# Patient Record
Sex: Male | Born: 1970 | Race: White | Hispanic: No | Marital: Single | Smoking: Former smoker
Health system: Southern US, Community
[De-identification: ages and names within clinical notes are randomized; demographics above are authoritative.]

## PROBLEM LIST (undated history)

## (undated) DIAGNOSIS — S42009A Fracture of unspecified part of unspecified clavicle, initial encounter for closed fracture: Secondary | ICD-10-CM

## (undated) DIAGNOSIS — S069X9A Unspecified intracranial injury with loss of consciousness of unspecified duration, initial encounter: Secondary | ICD-10-CM

## (undated) DIAGNOSIS — R51 Headache: Secondary | ICD-10-CM

## (undated) DIAGNOSIS — J189 Pneumonia, unspecified organism: Secondary | ICD-10-CM

## (undated) DIAGNOSIS — S069XAA Unspecified intracranial injury with loss of consciousness status unknown, initial encounter: Secondary | ICD-10-CM

## (undated) HISTORY — PX: WISDOM TOOTH EXTRACTION: SHX21

---

## 2013-09-30 ENCOUNTER — Emergency Department (HOSPITAL_COMMUNITY): Payer: BC Managed Care – PPO

## 2013-09-30 ENCOUNTER — Encounter (HOSPITAL_COMMUNITY): Payer: Self-pay | Admitting: Emergency Medicine

## 2013-09-30 ENCOUNTER — Inpatient Hospital Stay (HOSPITAL_COMMUNITY)
Admission: EM | Admit: 2013-09-30 | Discharge: 2013-10-05 | DRG: 086 | Disposition: A | Discharge status: Home / Self Care | Payer: BC Managed Care – PPO | Attending: General Surgery | Admitting: General Surgery

## 2013-09-30 ENCOUNTER — Inpatient Hospital Stay (HOSPITAL_COMMUNITY): Payer: BC Managed Care – PPO

## 2013-09-30 DIAGNOSIS — H748X9 Other specified disorders of middle ear and mastoid, unspecified ear: Secondary | ICD-10-CM | POA: Diagnosis present

## 2013-09-30 DIAGNOSIS — S42009A Fracture of unspecified part of unspecified clavicle, initial encounter for closed fracture: Secondary | ICD-10-CM | POA: Diagnosis present

## 2013-09-30 DIAGNOSIS — S0291XA Unspecified fracture of skull, initial encounter for closed fracture: Secondary | ICD-10-CM | POA: Diagnosis present

## 2013-09-30 DIAGNOSIS — S065XAA Traumatic subdural hemorrhage with loss of consciousness status unknown, initial encounter: Principal | ICD-10-CM | POA: Diagnosis present

## 2013-09-30 DIAGNOSIS — Z87891 Personal history of nicotine dependence: Secondary | ICD-10-CM | POA: Diagnosis not present

## 2013-09-30 DIAGNOSIS — S066X1A Traumatic subarachnoid hemorrhage with loss of consciousness of 30 minutes or less, initial encounter: Secondary | ICD-10-CM

## 2013-09-30 DIAGNOSIS — S066XAA Traumatic subarachnoid hemorrhage with loss of consciousness status unknown, initial encounter: Secondary | ICD-10-CM

## 2013-09-30 DIAGNOSIS — S42002A Fracture of unspecified part of left clavicle, initial encounter for closed fracture: Secondary | ICD-10-CM

## 2013-09-30 DIAGNOSIS — IMO0002 Reserved for concepts with insufficient information to code with codable children: Secondary | ICD-10-CM

## 2013-09-30 DIAGNOSIS — S066X9A Traumatic subarachnoid hemorrhage with loss of consciousness of unspecified duration, initial encounter: Secondary | ICD-10-CM

## 2013-09-30 DIAGNOSIS — S2239XA Fracture of one rib, unspecified side, initial encounter for closed fracture: Secondary | ICD-10-CM | POA: Diagnosis present

## 2013-09-30 DIAGNOSIS — T148XXA Other injury of unspecified body region, initial encounter: Secondary | ICD-10-CM

## 2013-09-30 DIAGNOSIS — S06309A Unspecified focal traumatic brain injury with loss of consciousness of unspecified duration, initial encounter: Principal | ICD-10-CM

## 2013-09-30 DIAGNOSIS — I498 Other specified cardiac arrhythmias: Secondary | ICD-10-CM | POA: Diagnosis present

## 2013-09-30 DIAGNOSIS — S02109A Fracture of base of skull, unspecified side, initial encounter for closed fracture: Secondary | ICD-10-CM | POA: Diagnosis not present

## 2013-09-30 DIAGNOSIS — D62 Acute posthemorrhagic anemia: Secondary | ICD-10-CM | POA: Diagnosis not present

## 2013-09-30 DIAGNOSIS — S0990XA Unspecified injury of head, initial encounter: Secondary | ICD-10-CM

## 2013-09-30 DIAGNOSIS — R4182 Altered mental status, unspecified: Secondary | ICD-10-CM

## 2013-09-30 LAB — CBC WITH DIFFERENTIAL/PLATELET
Basophils Absolute: 0 10*3/uL (ref 0.0–0.1)
Basophils Relative: 0 % (ref 0–1)
Eosinophils Absolute: 0 10*3/uL (ref 0.0–0.7)
Eosinophils Relative: 0 % (ref 0–5)
HCT: 40.9 % (ref 39.0–52.0)
Hemoglobin: 13.4 g/dL (ref 13.0–17.0)
Lymphocytes Relative: 8 % — ABNORMAL LOW (ref 12–46)
Lymphs Abs: 1.4 10*3/uL (ref 0.7–4.0)
MCH: 28.9 pg (ref 26.0–34.0)
MCHC: 32.8 g/dL (ref 30.0–36.0)
MCV: 88.3 fL (ref 78.0–100.0)
Monocytes Absolute: 0.9 10*3/uL (ref 0.1–1.0)
Monocytes Relative: 5 % (ref 3–12)
Neutro Abs: 15 10*3/uL — ABNORMAL HIGH (ref 1.7–7.7)
Neutrophils Relative %: 87 % — ABNORMAL HIGH (ref 43–77)
Platelets: 187 10*3/uL (ref 150–400)
RBC: 4.63 MIL/uL (ref 4.22–5.81)
RDW: 12.4 % (ref 11.5–15.5)
WBC: 17.3 10*3/uL — ABNORMAL HIGH (ref 4.0–10.5)

## 2013-09-30 LAB — TYPE AND SCREEN
ABO/RH(D): O POS
Antibody Screen: NEGATIVE

## 2013-09-30 LAB — GLUCOSE, CAPILLARY: Glucose-Capillary: 136 mg/dL — ABNORMAL HIGH (ref 70–99)

## 2013-09-30 LAB — COMPREHENSIVE METABOLIC PANEL
ALT: 23 U/L (ref 0–53)
AST: 33 U/L (ref 0–37)
Albumin: 3.8 g/dL (ref 3.5–5.2)
Alkaline Phosphatase: 74 U/L (ref 39–117)
Anion gap: 17 — ABNORMAL HIGH (ref 5–15)
BUN: 11 mg/dL (ref 6–23)
CO2: 19 mEq/L (ref 19–32)
Calcium: 9 mg/dL (ref 8.4–10.5)
Chloride: 103 mEq/L (ref 96–112)
Creatinine, Ser: 0.72 mg/dL (ref 0.50–1.35)
GFR calc Af Amer: >90 mL/min (ref >90)
GFR calc non Af Amer: >90 mL/min (ref >90)
Glucose, Bld: 142 mg/dL — ABNORMAL HIGH (ref 70–99)
Potassium: 3.6 mEq/L — ABNORMAL LOW (ref 3.7–5.3)
Sodium: 139 mEq/L (ref 137–147)
Total Bilirubin: 0.3 mg/dL (ref 0.3–1.2)
Total Protein: 7 g/dL (ref 6.0–8.3)

## 2013-09-30 LAB — RAPID URINE DRUG SCREEN, HOSP PERFORMED
Amphetamines: NOT DETECTED
Barbiturates: NOT DETECTED
Benzodiazepines: NOT DETECTED
Cocaine: NOT DETECTED
Opiates: POSITIVE — AB
Tetrahydrocannabinol: NOT DETECTED

## 2013-09-30 LAB — PROTIME-INR
INR: 1.1 (ref 0.00–1.49)
Prothrombin Time: 14.2 seconds (ref 11.6–15.2)

## 2013-09-30 LAB — APTT: aPTT: 24 seconds (ref 24–37)

## 2013-09-30 LAB — ABO/RH: ABO/RH(D): O POS

## 2013-09-30 LAB — CBG MONITORING, ED: Glucose-Capillary: 125 mg/dL — ABNORMAL HIGH (ref 70–99)

## 2013-09-30 LAB — ETHANOL: Alcohol, Ethyl (B): 105 mg/dL — ABNORMAL HIGH (ref 0–11)

## 2013-09-30 LAB — I-STAT CG4 LACTIC ACID, ED: Lactic Acid, Venous: 3.4 mmol/L — ABNORMAL HIGH (ref 0.5–2.2)

## 2013-09-30 MED ORDER — ONDANSETRON HCL 4 MG/2ML IJ SOLN
4.0000 mg | Freq: Once | INTRAMUSCULAR | Status: AC
  Administered 2013-09-30: 4 mg via INTRAVENOUS
  Filled 2013-09-30: qty 2

## 2013-09-30 MED ORDER — IOHEXOL 300 MG/ML  SOLN
100.0000 mL | Freq: Once | INTRAMUSCULAR | Status: AC | PRN
  Administered 2013-09-30: 100 mL via INTRAVENOUS

## 2013-09-30 MED ORDER — OXYCODONE HCL 5 MG PO TABS
10.0000 mg | ORAL_TABLET | ORAL | Status: DC | PRN
  Administered 2013-09-30 – 2013-10-04 (×9): 10 mg via ORAL
  Filled 2013-09-30 (×8): qty 2

## 2013-09-30 MED ORDER — ONDANSETRON HCL 4 MG/2ML IJ SOLN
INTRAMUSCULAR | Status: AC
  Administered 2013-09-30: 4 mg
  Filled 2013-09-30: qty 2

## 2013-09-30 MED ORDER — ACETAMINOPHEN 325 MG PO TABS
650.0000 mg | ORAL_TABLET | ORAL | Status: DC | PRN
  Administered 2013-10-01 – 2013-10-04 (×4): 650 mg via ORAL
  Filled 2013-09-30 (×5): qty 2

## 2013-09-30 MED ORDER — ONDANSETRON HCL 4 MG PO TABS: 4.0000 mg | ORAL_TABLET | Freq: Four times a day (QID) | ORAL | Status: DC | PRN

## 2013-09-30 MED ORDER — MORPHINE SULFATE 2 MG/ML IJ SOLN
2.0000 mg | INTRAMUSCULAR | Status: DC | PRN
  Administered 2013-09-30 – 2013-10-02 (×4): 2 mg via INTRAVENOUS
  Filled 2013-09-30 (×4): qty 1

## 2013-09-30 MED ORDER — PANTOPRAZOLE SODIUM 40 MG PO TBEC
40.0000 mg | DELAYED_RELEASE_TABLET | Freq: Every day | ORAL | Status: DC
  Administered 2013-10-03: 40 mg via ORAL
  Filled 2013-09-30 (×3): qty 1

## 2013-09-30 MED ORDER — SODIUM CHLORIDE 0.9 % IV BOLUS (SEPSIS)
1000.0000 mL | Freq: Once | INTRAVENOUS | Status: AC
  Administered 2013-09-30: 1000 mL via INTRAVENOUS

## 2013-09-30 MED ORDER — ONDANSETRON HCL 4 MG/2ML IJ SOLN
4.0000 mg | Freq: Four times a day (QID) | INTRAMUSCULAR | Status: DC | PRN
  Administered 2013-09-30 (×2): 4 mg via INTRAVENOUS
  Filled 2013-09-30 (×2): qty 2

## 2013-09-30 MED ORDER — TETANUS-DIPHTH-ACELL PERTUSSIS 5-2.5-18.5 LF-MCG/0.5 IM SUSP
0.5000 mL | Freq: Once | INTRAMUSCULAR | Status: AC
  Administered 2013-09-30: 0.5 mL via INTRAMUSCULAR
  Filled 2013-09-30: qty 0.5

## 2013-09-30 MED ORDER — OXYCODONE HCL 5 MG PO TABS
5.0000 mg | ORAL_TABLET | ORAL | Status: DC | PRN
  Administered 2013-09-30 – 2013-10-03 (×2): 5 mg via ORAL
  Filled 2013-09-30 (×4): qty 1

## 2013-09-30 MED ORDER — PANTOPRAZOLE SODIUM 40 MG IV SOLR
40.0000 mg | Freq: Every day | INTRAVENOUS | Status: DC
  Administered 2013-09-30: 40 mg via INTRAVENOUS
  Filled 2013-09-30 (×4): qty 40

## 2013-09-30 MED ORDER — POTASSIUM CHLORIDE IN NACL 20-0.9 MEQ/L-% IV SOLN
INTRAVENOUS | Status: DC
  Administered 2013-09-30 – 2013-10-02 (×4): via INTRAVENOUS
  Filled 2013-09-30 (×6): qty 1000

## 2013-09-30 NOTE — ED Notes (Signed)
Neuro surgery at bedside.

## 2013-09-30 NOTE — Progress Notes (Signed)
Orthopedic Tech Progress Note Patient Details:  [name] EvensBrian Sproles 02-22-1970 [id number] [employer] Type of Ortho Device: Shoulder immobilizer Ortho Device/Splint Location: LUE Ortho Device/Splint Interventions: Application   Asia R Thompson 09/30/2013, 8:18 AM

## 2013-09-30 NOTE — ED Notes (Signed)
Aspen Collar placed on patient
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
## 2013-09-30 NOTE — Progress Notes (Signed)
Pt admitted to room 3M10. He is nauseated. He complains of pain. He is sleepy but oriented and MAE. His pupils are 3 and brisk bilaterally. Pain and anti-emetic given in ED at 15:38.
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
## 2013-09-30 NOTE — ED Notes (Signed)
Notified Ortho tech that pt needs shoulder immobilizer

## 2013-09-30 NOTE — ED Notes (Signed)
Ortho tech applied sling to left arm

## 2013-10-01 ENCOUNTER — Inpatient Hospital Stay (HOSPITAL_COMMUNITY): Payer: BC Managed Care – PPO

## 2013-10-01 LAB — CBC
HCT: 38.3 % — ABNORMAL LOW (ref 39.0–52.0)
Hemoglobin: 13 g/dL (ref 13.0–17.0)
MCH: 29.3 pg (ref 26.0–34.0)
MCHC: 33.9 g/dL (ref 30.0–36.0)
MCV: 86.3 fL (ref 78.0–100.0)
Platelets: 159 10*3/uL (ref 150–400)
RBC: 4.44 MIL/uL (ref 4.22–5.81)
RDW: 12.3 % (ref 11.5–15.5)
WBC: 17.7 10*3/uL — ABNORMAL HIGH (ref 4.0–10.5)

## 2013-10-01 LAB — BASIC METABOLIC PANEL
Anion gap: 13 (ref 5–15)
BUN: 8 mg/dL (ref 6–23)
CO2: 23 mEq/L (ref 19–32)
Calcium: 8.9 mg/dL (ref 8.4–10.5)
Chloride: 103 mEq/L (ref 96–112)
Creatinine, Ser: 0.71 mg/dL (ref 0.50–1.35)
GFR calc Af Amer: >90 mL/min (ref >90)
GFR calc non Af Amer: >90 mL/min (ref >90)
Glucose, Bld: 141 mg/dL — ABNORMAL HIGH (ref 70–99)
Potassium: 4.4 mEq/L (ref 3.7–5.3)
Sodium: 139 mEq/L (ref 137–147)
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
## 2013-10-01 NOTE — Plan of Care (Signed)
Problem: Phase I Progression Outcomes Goal: Voiding-avoid urinary catheter unless indicated Outcome: Completed/Met Date Met:  10/01/13 Pt voids in urinal
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
## 2013-10-02 ENCOUNTER — Inpatient Hospital Stay (HOSPITAL_COMMUNITY): Payer: BC Managed Care – PPO

## 2013-10-02 ENCOUNTER — Encounter (HOSPITAL_COMMUNITY): Payer: Self-pay

## 2013-10-02 DIAGNOSIS — S2249XA Multiple fractures of ribs, unspecified side, initial encounter for closed fracture: Secondary | ICD-10-CM

## 2013-10-02 LAB — BASIC METABOLIC PANEL
Anion gap: 11 (ref 5–15)
BUN: 11 mg/dL (ref 6–23)
CO2: 23 mEq/L (ref 19–32)
Calcium: 8.6 mg/dL (ref 8.4–10.5)
Chloride: 104 mEq/L (ref 96–112)
Creatinine, Ser: 0.6 mg/dL (ref 0.50–1.35)
GFR calc Af Amer: >90 mL/min (ref >90)
GFR calc non Af Amer: >90 mL/min (ref >90)
Glucose, Bld: 129 mg/dL — ABNORMAL HIGH (ref 70–99)
Potassium: 4 mEq/L (ref 3.7–5.3)
Sodium: 138 mEq/L (ref 137–147)

## 2013-10-02 LAB — CBC
HCT: 36 % — ABNORMAL LOW (ref 39.0–52.0)
Hemoglobin: 11.6 g/dL — ABNORMAL LOW (ref 13.0–17.0)
MCH: 28.2 pg (ref 26.0–34.0)
MCHC: 32.2 g/dL (ref 30.0–36.0)
MCV: 87.4 fL (ref 78.0–100.0)
Platelets: 146 10*3/uL — ABNORMAL LOW (ref 150–400)
RBC: 4.12 MIL/uL — ABNORMAL LOW (ref 4.22–5.81)
RDW: 12.5 % (ref 11.5–15.5)
WBC: 12.4 10*3/uL — ABNORMAL HIGH (ref 4.0–10.5)

## 2013-10-02 NOTE — Progress Notes (Signed)
Patient ID: [name] [name], male   DOB: 07-08-1970, 43 y.o.   MRN: [id number] Patient is awake alert decreased headache no nausea  Awake alert oriented strength out of 5 no pronator drift  CT head stable yesterday morning.  Continue to monitor in the ICU is having intermittent junctional bradycardia blood pressure okay
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
## 2013-10-03 MED ORDER — SODIUM CHLORIDE 0.9 % IJ SOLN: 3.0000 mL | INTRAMUSCULAR | Status: DC | PRN

## 2013-10-03 MED ORDER — CARBAMIDE PEROXIDE 6.5 % OT SOLN
5.0000 [drp] | Freq: Two times a day (BID) | OTIC | Status: DC
  Administered 2013-10-03 – 2013-10-05 (×5): 5 [drp] via OTIC
  Filled 2013-10-03: qty 15
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
## 2013-10-04 ENCOUNTER — Inpatient Hospital Stay (HOSPITAL_COMMUNITY): Payer: BC Managed Care – PPO

## 2013-10-04 DIAGNOSIS — S42002A Fracture of unspecified part of left clavicle, initial encounter for closed fracture: Secondary | ICD-10-CM | POA: Diagnosis present

## 2013-10-04 DIAGNOSIS — D62 Acute posthemorrhagic anemia: Secondary | ICD-10-CM | POA: Diagnosis not present

## 2013-10-04 DIAGNOSIS — S0291XA Unspecified fracture of skull, initial encounter for closed fracture: Secondary | ICD-10-CM | POA: Diagnosis present

## 2013-10-04 MED ORDER — OXYCODONE-ACETAMINOPHEN 5-325 MG PO TABS: 1.0000 | ORAL_TABLET | ORAL | Status: DC | PRN

## 2013-10-04 MED ORDER — POLYETHYLENE GLYCOL 3350 17 G PO PACK
17.0000 g | PACK | Freq: Every day | ORAL | Status: DC
  Filled 2013-10-04: qty 1

## 2013-10-04 MED ORDER — CARBAMIDE PEROXIDE 6.5 % OT SOLN: 5.0000 [drp] | Freq: Two times a day (BID) | OTIC | Status: AC

## 2013-10-04 MED ORDER — DOCUSATE SODIUM 100 MG PO CAPS
100.0000 mg | ORAL_CAPSULE | Freq: Two times a day (BID) | ORAL | Status: DC
  Filled 2013-10-04 (×2): qty 1

## 2013-10-04 MED ORDER — OXYCODONE HCL 5 MG PO TABS
5.0000 mg | ORAL_TABLET | ORAL | Status: DC | PRN
  Administered 2013-10-04 – 2013-10-05 (×3): 15 mg via ORAL
  Filled 2013-10-04 (×4): qty 3
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
## 2013-10-04 NOTE — Progress Notes (Signed)
I received a prescreen request from OT this afternoon and OT gave update that pt had increased difficulty today. I reviewed pt's case and made note of previous recommendations for home and outpatient support. I then spoke with trauma PA Casimiro Needle who stated that pt was planned to be discharged to home later today.  In reviewing pt's insurance, pt has [employer] and based on pt's current functional level of ambulating 250' with min guard assist and min guard assist for transfers, it is unlikely that [employer]BS would give authorization for inpatient rehab.  I discussed this with Casimiro Needle, trauma PA and [place], OT. At this time, we would recommend pursuing home with family assist and outpt therapy support.  Please call me with any questions. Thanks.  Juliann Mule, PT Rehabilitation Admissions Coordinator [phone]

## 2013-10-04 NOTE — Progress Notes (Signed)
Family is close to deciding how they will provide 24/7 supervision.  Patient does now qualify for, and does not want, inpatient rehab.  Will be appropriate for home health PT/OT/ST.  This patient has been seen and I agree with the findings and treatment plan.  Marta Lamas. Gae Bon, MD, FACS [phone] (pager) [phone] (direct pager) Trauma Surgeon

## 2013-10-04 NOTE — Progress Notes (Signed)
UR completed.  Valoria Tamburri, RN BSN MHA CCM Trauma/Neuro ICU Case Manager [phone]  

## 2013-10-04 NOTE — Discharge Instructions (Signed)
No driving until cleared by neurosurgery.

## 2013-10-05 NOTE — Progress Notes (Signed)
Trauma Service Note  Subjective: Repeat head CT does not show any significant problem.  No worsening.  Objective: Vital signs in last 24 hours: Temp:  [97.7 F (36.5 C)-98.7 F (37.1 C)] 98.2 F (36.8 C) (08/26 0555) Pulse Rate:  [46-55] 55 (08/26 0555) Resp:  [16-18] 16 (08/26 0555) BP: (134-143)/(73-84) 135/81 mmHg (08/26 0555) SpO2:  [94 %-100 %] 99 % (08/26 0555)    Intake/Output from previous day:   Intake/Output this shift:    General: Patient still a bit sleepy.  Family reconsidering discharge options.  Lungs: Clear  Abd: Benign  Extremities: No changes.  Left shoulder in sling.  Neuro: Intact.  Repeat CT was okay.  No worsening.  No increase blood.  No shift.  Lab Results: CBC   Recent Labs  10/02/13 0820  WBC 12.4*  HGB 11.6*  HCT 36.0*  PLT 146*   BMET  Recent Labs  10/02/13 0820  NA 138  K 4.0  CL 104  CO2 23  GLUCOSE 129*  BUN 11  CREATININE 0.60  CALCIUM 8.6   PT/INR No results found for this basename: LABPROT, INR,  in the last 72 hours ABG No results found for this basename: PHART, PCO2, PO2, HCO3,  in the last 72 hours  Studies/Results: Dg Clavicle Left  10/04/2013   CLINICAL DATA:  Pain .  EXAM: LEFT CLAVICLE - 2+ VIEWS  COMPARISON:  10/01/2013.  FINDINGS: Displaced fracture of the midportion of the left clavicle again noted. Displacement is slightly improved from prior exam. Similar finding noted on prior study.  IMPRESSION: 1. Displaced fracture of the midportion of the left clavicle again noted. Displacement is slightly improved from prior exam. 2. No other focal abnormality .   Electronically Signed   By: Maisie Fus  Register   On: 10/04/2013 08:07   Ct Head Wo Contrast  10/04/2013   CLINICAL DATA:  Trauma.  Headache.  EXAM: CT HEAD WITHOUT CONTRAST  TECHNIQUE: Contiguous axial images were obtained from the base of the skull through the vertex without intravenous contrast.  COMPARISON:  CT 10/01/2013.  FINDINGS: Residual parenchymal  and subarachnoid small hemorrhages noted. Residual tiny tentorial subdural hematoma noted. No evidence of progressive hemorrhage. No significant new hemorrhage. Right frontal cerebral edema is stable. No hydrocephalus. Orbits are unremarkable. Mild mucosal thickening sphenoid sinus. Mild amount of fluid left mastoid sinuses. Previously described skull fractures are stable.  IMPRESSION: 1. Residual tiny parenchymal and subarachnoid hemorrhages again noted. No evidence of progression. Residual tiny tentorial subdural hematoma. No evidence of progression. No new significant hemorrhage. 2. Right frontal cerebral edema stable. 3. Previously described skull fractures are stable .   Electronically Signed   By: Maisie Fus  Register   On: 10/04/2013 17:00    Anti-infectives: Anti-infectives   None      Assessment/Plan: s/p  Discharge Family wants to reconsider Rehab options.  Based on my discussion with the family they just do not feel comfortable with his safety going home, even with 24 hour supervision.  Will get Rehab to re-evaluate situation to see if the patient would possibly qualify for rehab--if not then he would have to be placed in a SNF or go home with 24 hour assistance.  LOS: 5 days   Marta Lamas. Gae Bon, MD, FACS [phone] Trauma Surgeon 10/05/2013

## 2013-10-05 NOTE — Progress Notes (Signed)
Discharge instruction reviewed with patient/family. RX given to family. All questions answered at this time.   Sim Boast, RN
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
## 2013-10-05 NOTE — Progress Notes (Signed)
Subjective: Patient reports "I got dizzy when I stood up. This shoulder started huting and I got lightheaded"  Objective: Vital signs in last 24 hours: Temp:  [97.7 F (36.5 C)-98.7 F (37.1 C)] 98.2 F (36.8 C) (08/26 0555) Pulse Rate:  [46-55] 55 (08/26 0555) Resp:  [16-18] 16 (08/26 0555) BP: (134-143)/(73-84) 135/81 mmHg (08/26 0555) SpO2:  [94 %-100 %] 99 % (08/26 0555)  Intake/Output from previous day:   Intake/Output this shift:    Awakens to voice, and answers direct questions, but is quite sleepy. CT without change. Family present, voicing concern about his planned discharge. DrWyatt with Trauma has requested another Rehab eval at family's request.  Lab Results: No results found for this basename: WBC, HGB, HCT, PLT,  in the last 72 hours BMET No results found for this basename: NA, K, CL, CO2, GLUCOSE, BUN, CREATININE, CALCIUM,  in the last 72 hours  Studies/Results: Dg Clavicle Left  10/04/2013   CLINICAL DATA:  Pain .  EXAM: LEFT CLAVICLE - 2+ VIEWS  COMPARISON:  10/01/2013.  FINDINGS: Displaced fracture of the midportion of the left clavicle again noted. Displacement is slightly improved from prior exam. Similar finding noted on prior study.  IMPRESSION: 1. Displaced fracture of the midportion of the left clavicle again noted. Displacement is slightly improved from prior exam. 2. No other focal abnormality .   Electronically Signed   By: Maisie Fus  Register   On: 10/04/2013 08:07   Ct Head Wo Contrast  10/04/2013   CLINICAL DATA:  Trauma.  Headache.  EXAM: CT HEAD WITHOUT CONTRAST  TECHNIQUE: Contiguous axial images were obtained from the base of the skull through the vertex without intravenous contrast.  COMPARISON:  CT 10/01/2013.  FINDINGS: Residual parenchymal and subarachnoid small hemorrhages noted. Residual tiny tentorial subdural hematoma noted. No evidence of progressive hemorrhage. No significant new hemorrhage. Right frontal cerebral edema is stable. No  hydrocephalus. Orbits are unremarkable. Mild mucosal thickening sphenoid sinus. Mild amount of fluid left mastoid sinuses. Previously described skull fractures are stable.  IMPRESSION: 1. Residual tiny parenchymal and subarachnoid hemorrhages again noted. No evidence of progression. Residual tiny tentorial subdural hematoma. No evidence of progression. No new significant hemorrhage. 2. Right frontal cerebral edema stable. 3. Previously described skull fractures are stable .   Electronically Signed   By: Maisie Fus  Register   On: 10/04/2013 17:00    Assessment/Plan:    LOS: 5 days  CT without change. Discussed recovery process with pt's father. Plan for 65month f/u with DrStern in office.   [name] [name], [name] [name] 10/05/2013, 8:42 AM
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
## 2013-10-05 NOTE — Progress Notes (Signed)
Rehab admissions - I met with pt and his parents and girlfriend to discuss different discharge options. Dr. Naaman Plummer, rehab MD, also came into room to directly discuss pt's case with pt and his family. Please see my note from yesterday.  At this time, pt is too high level for our rehab program and it is not likely that [facility] would give authorization for inpatient rehab. Dr. Naaman Plummer recommended that acute therapy perform vestibular assessment today and that pt be followed up with neuro outpatient services with family providing supervision at home.  In my discussion with parents and girlfriend, they are not sure if they can provide 24 hr supervision as parents live in [place] and girlfriend is local but works. Parents are considering pursuing SNF. When I asked pt about his discharge preferences, pt stated he would like to go to SNF for further rehab. Per Education officer, museum, [employer] may also not give authorization for SNF due to pt being high level, Min guard 250' with activity.   I left messages with Sharyn Lull, trauma case Freight forwarder and Raquel Sarna with social work.   Our recommendation is to pursue neuro outpatient services with supervision support from family. Social worker and Tourist information centre manager to follow up.  Thank you.  Nanetta Batty, PT Rehabilitation Admissions Coordinator [phone]
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
## 2013-10-11 ENCOUNTER — Telehealth (HOSPITAL_COMMUNITY): Payer: Self-pay

## 2013-10-11 NOTE — Telephone Encounter (Signed)
Pt did not really have any questions about pain meds.  We clarified his follows up with [facility] and ortho.  Encouraged to call back with questions or concerns.   Takahiro Godinho, ANP-BC

## 2013-10-13 ENCOUNTER — Encounter (HOSPITAL_COMMUNITY): Payer: Self-pay | Admitting: Pharmacist

## 2013-10-13 ENCOUNTER — Encounter (HOSPITAL_COMMUNITY): Payer: Self-pay | Admitting: *Deleted

## 2013-10-13 NOTE — Progress Notes (Signed)
Called pt for pre-op call, he requested that his parents listen in on the call due to issues he has with his memory. He put phone on speaker and I talked with all 3 of them.

## 2013-10-13 NOTE — Progress Notes (Signed)
No pre-op orders in EPIC, called Dr. Magdalene Patricia office and spoke with [facility]. She states she will give message to Dr. Carola Frost.

## 2013-10-14 ENCOUNTER — Encounter (HOSPITAL_COMMUNITY): Payer: Self-pay | Admitting: *Deleted

## 2013-10-14 ENCOUNTER — Ambulatory Visit (HOSPITAL_COMMUNITY): Payer: BC Managed Care – PPO | Admitting: Anesthesiology

## 2013-10-14 ENCOUNTER — Encounter (HOSPITAL_COMMUNITY)
Admission: RE | Disposition: A | Discharge status: Home / Self Care | Payer: Self-pay | Source: Ambulatory Visit | Attending: Orthopedic Surgery

## 2013-10-14 ENCOUNTER — Ambulatory Visit (HOSPITAL_COMMUNITY): Payer: BC Managed Care – PPO

## 2013-10-14 ENCOUNTER — Ambulatory Visit (HOSPITAL_COMMUNITY)
Admission: RE | Admit: 2013-10-14 | Discharge: 2013-10-14 | Disposition: A | Discharge status: Home / Self Care | Payer: BC Managed Care – PPO | Source: Ambulatory Visit | Attending: Orthopedic Surgery | Admitting: Orthopedic Surgery

## 2013-10-14 ENCOUNTER — Encounter (HOSPITAL_COMMUNITY): Payer: BC Managed Care – PPO | Admitting: Anesthesiology

## 2013-10-14 DIAGNOSIS — Z87820 Personal history of traumatic brain injury: Secondary | ICD-10-CM | POA: Insufficient documentation

## 2013-10-14 DIAGNOSIS — Z87891 Personal history of nicotine dependence: Secondary | ICD-10-CM | POA: Insufficient documentation

## 2013-10-14 DIAGNOSIS — S42009A Fracture of unspecified part of unspecified clavicle, initial encounter for closed fracture: Secondary | ICD-10-CM | POA: Diagnosis present

## 2013-10-14 DIAGNOSIS — Y92410 Unspecified street and highway as the place of occurrence of the external cause: Secondary | ICD-10-CM | POA: Diagnosis not present

## 2013-10-14 HISTORY — DX: Unspecified intracranial injury with loss of consciousness status unknown, initial encounter: S06.9XAA

## 2013-10-14 HISTORY — PX: ORIF CLAVICULAR FRACTURE: SHX5055

## 2013-10-14 HISTORY — DX: Unspecified intracranial injury with loss of consciousness of unspecified duration, initial encounter: S06.9X9A

## 2013-10-14 HISTORY — DX: Headache: R51

## 2013-10-14 HISTORY — DX: Fracture of unspecified part of unspecified clavicle, initial encounter for closed fracture: S42.009A

## 2013-10-14 HISTORY — DX: Pneumonia, unspecified organism: J18.9

## 2013-10-14 SURGERY — OPEN REDUCTION INTERNAL FIXATION (ORIF) CLAVICULAR FRACTURE
Anesthesia: General | Laterality: Left

## 2013-10-14 MED ORDER — PROMETHAZINE HCL 25 MG/ML IJ SOLN: 6.2500 mg | INTRAMUSCULAR | Status: DC | PRN

## 2013-10-14 MED ORDER — FENTANYL CITRATE 0.05 MG/ML IJ SOLN
INTRAMUSCULAR | Status: AC
  Filled 2013-10-14: qty 5

## 2013-10-14 MED ORDER — NEOSTIGMINE METHYLSULFATE 10 MG/10ML IV SOLN
INTRAVENOUS | Status: AC
  Filled 2013-10-14: qty 1

## 2013-10-14 MED ORDER — BUPIVACAINE-EPINEPHRINE 0.5% -1:200000 IJ SOLN
INTRAMUSCULAR | Status: DC | PRN
  Administered 2013-10-14: 10 mL

## 2013-10-14 MED ORDER — FENTANYL CITRATE 0.05 MG/ML IJ SOLN
INTRAMUSCULAR | Status: AC
  Filled 2013-10-14: qty 2

## 2013-10-14 MED ORDER — ROCURONIUM BROMIDE 50 MG/5ML IV SOLN
INTRAVENOUS | Status: AC
  Filled 2013-10-14: qty 1

## 2013-10-14 MED ORDER — METHOCARBAMOL 1000 MG/10ML IJ SOLN
500.0000 mg | Freq: Three times a day (TID) | INTRAMUSCULAR | Status: DC
  Filled 2013-10-14 (×3): qty 5

## 2013-10-14 MED ORDER — LIDOCAINE HCL (CARDIAC) 20 MG/ML IV SOLN
INTRAVENOUS | Status: AC
  Filled 2013-10-14: qty 10

## 2013-10-14 MED ORDER — FENTANYL CITRATE 0.05 MG/ML IJ SOLN
INTRAMUSCULAR | Status: DC | PRN
  Administered 2013-10-14: 75 ug via INTRAVENOUS
  Administered 2013-10-14 (×2): 50 ug via INTRAVENOUS
  Administered 2013-10-14: 25 ug via INTRAVENOUS
  Administered 2013-10-14: 50 ug via INTRAVENOUS

## 2013-10-14 MED ORDER — SODIUM CHLORIDE 0.9 % IJ SOLN
INTRAMUSCULAR | Status: AC
  Filled 2013-10-14: qty 10

## 2013-10-14 MED ORDER — MIDAZOLAM HCL 2 MG/2ML IJ SOLN
INTRAMUSCULAR | Status: AC
  Filled 2013-10-14: qty 2

## 2013-10-14 MED ORDER — PROPOFOL 10 MG/ML IV BOLUS
INTRAVENOUS | Status: AC
  Filled 2013-10-14: qty 20

## 2013-10-14 MED ORDER — LIDOCAINE HCL (CARDIAC) 20 MG/ML IV SOLN
INTRAVENOUS | Status: DC | PRN
  Administered 2013-10-14: 90 mg via INTRAVENOUS

## 2013-10-14 MED ORDER — CEFAZOLIN SODIUM-DEXTROSE 2-3 GM-% IV SOLR
INTRAVENOUS | Status: DC | PRN
  Administered 2013-10-14: 2 g via INTRAVENOUS

## 2013-10-14 MED ORDER — MEPERIDINE HCL 25 MG/ML IJ SOLN: 6.2500 mg | INTRAMUSCULAR | Status: DC | PRN

## 2013-10-14 MED ORDER — LACTATED RINGERS IV SOLN
INTRAVENOUS | Status: DC | PRN
  Administered 2013-10-14 (×2): via INTRAVENOUS

## 2013-10-14 MED ORDER — ONDANSETRON HCL 4 MG PO TABS: 4.0000 mg | ORAL_TABLET | Freq: Four times a day (QID) | ORAL | Status: AC | PRN

## 2013-10-14 MED ORDER — BUPIVACAINE-EPINEPHRINE (PF) 0.5% -1:200000 IJ SOLN
INTRAMUSCULAR | Status: AC
  Filled 2013-10-14: qty 30

## 2013-10-14 MED ORDER — ONDANSETRON HCL 4 MG/2ML IJ SOLN
INTRAMUSCULAR | Status: AC
  Filled 2013-10-14: qty 2

## 2013-10-14 MED ORDER — SUCCINYLCHOLINE CHLORIDE 20 MG/ML IJ SOLN
INTRAMUSCULAR | Status: AC
  Filled 2013-10-14: qty 1

## 2013-10-14 MED ORDER — ROCURONIUM BROMIDE 100 MG/10ML IV SOLN
INTRAVENOUS | Status: DC | PRN
  Administered 2013-10-14: 35 mg via INTRAVENOUS
  Administered 2013-10-14: 15 mg via INTRAVENOUS

## 2013-10-14 MED ORDER — LIDOCAINE HCL 4 % MT SOLN
OROMUCOSAL | Status: DC | PRN
  Administered 2013-10-14: 4 mL via TOPICAL

## 2013-10-14 MED ORDER — 0.9 % SODIUM CHLORIDE (POUR BTL) OPTIME
TOPICAL | Status: DC | PRN
  Administered 2013-10-14: 1000 mL

## 2013-10-14 MED ORDER — GLYCOPYRROLATE 0.2 MG/ML IJ SOLN
INTRAMUSCULAR | Status: DC | PRN
  Administered 2013-10-14: 0.6 mg via INTRAVENOUS

## 2013-10-14 MED ORDER — LACTATED RINGERS IV SOLN
INTRAVENOUS | Status: DC
  Administered 2013-10-14: 11:00:00 via INTRAVENOUS

## 2013-10-14 MED ORDER — DEXTROSE 5 % IV SOLN
INTRAVENOUS | Status: DC | PRN
  Administered 2013-10-14: 13:00:00 via INTRAVENOUS

## 2013-10-14 MED ORDER — FENTANYL CITRATE 0.05 MG/ML IJ SOLN
25.0000 ug | INTRAMUSCULAR | Status: DC | PRN
  Administered 2013-10-14: 25 ug via INTRAVENOUS
  Administered 2013-10-14 (×2): 50 ug via INTRAVENOUS
  Administered 2013-10-14: 25 ug via INTRAVENOUS

## 2013-10-14 MED ORDER — NEOSTIGMINE METHYLSULFATE 10 MG/10ML IV SOLN
INTRAVENOUS | Status: DC | PRN
  Administered 2013-10-14: 4 mg via INTRAVENOUS

## 2013-10-14 MED ORDER — GLYCOPYRROLATE 0.2 MG/ML IJ SOLN
INTRAMUSCULAR | Status: AC
Start: 2013-10-14 — End: 2013-10-14
  Filled 2013-10-14: qty 3

## 2013-10-14 MED ORDER — PROPOFOL 10 MG/ML IV BOLUS
INTRAVENOUS | Status: DC | PRN
  Administered 2013-10-14: 200 mg via INTRAVENOUS

## 2013-10-14 MED ORDER — ONDANSETRON HCL 4 MG PO TABS: 4.0000 mg | ORAL_TABLET | Freq: Three times a day (TID) | ORAL | Status: AC | PRN

## 2013-10-14 MED ORDER — EPHEDRINE SULFATE 50 MG/ML IJ SOLN
INTRAMUSCULAR | Status: AC
  Filled 2013-10-14: qty 1

## 2013-10-14 SURGICAL SUPPLY — 57 items
BIT DRILL 2.8X5 QR DISP (BIT) ×2 IMPLANT
BRUSH SCRUB DISP (MISCELLANEOUS) ×2 IMPLANT
CLSR STERI-STRIP ANTIMIC 1/2X4 (GAUZE/BANDAGES/DRESSINGS) ×2 IMPLANT
COVER SURGICAL LIGHT HANDLE (MISCELLANEOUS) ×2 IMPLANT
DRAPE C-ARM 42X72 X-RAY (DRAPES) ×2 IMPLANT
DRAPE C-ARMOR (DRAPES) IMPLANT
DRAPE INCISE IOBAN 66X45 STRL (DRAPES) ×2 IMPLANT
DRAPE ORTHO SPLIT 77X108 STRL (DRAPES) ×1
DRAPE SURG ORHT 6 SPLT 77X108 (DRAPES) ×1 IMPLANT
DRAPE U-SHAPE 47X51 STRL (DRAPES) ×4 IMPLANT
DRSG EMULSION OIL 3X3 NADH (GAUZE/BANDAGES/DRESSINGS) IMPLANT
DRSG MEPILEX BORDER 4X8 (GAUZE/BANDAGES/DRESSINGS) ×2 IMPLANT
ELECT NEEDLE TIP 2.8 STRL (NEEDLE) ×2 IMPLANT
ELECT REM PT RETURN 9FT ADLT (ELECTROSURGICAL) ×2
ELECTRODE REM PT RTRN 9FT ADLT (ELECTROSURGICAL) ×1 IMPLANT
GAUZE SPONGE 4X4 12PLY STRL (GAUZE/BANDAGES/DRESSINGS) IMPLANT
GLOVE BIO SURGEON STRL SZ7.5 (GLOVE) ×2 IMPLANT
GLOVE BIO SURGEON STRL SZ8 (GLOVE) ×2 IMPLANT
GLOVE BIOGEL PI IND STRL 7.5 (GLOVE) ×1 IMPLANT
GLOVE BIOGEL PI IND STRL 8 (GLOVE) ×1 IMPLANT
GLOVE BIOGEL PI INDICATOR 7.5 (GLOVE) ×1
GLOVE BIOGEL PI INDICATOR 8 (GLOVE) ×1
GOWN STRL REUS W/ TWL LRG LVL3 (GOWN DISPOSABLE) ×2 IMPLANT
GOWN STRL REUS W/ TWL XL LVL3 (GOWN DISPOSABLE) ×2 IMPLANT
GOWN STRL REUS W/TWL LRG LVL3 (GOWN DISPOSABLE) ×2
GOWN STRL REUS W/TWL XL LVL3 (GOWN DISPOSABLE) ×2
KIT BASIN OR (CUSTOM PROCEDURE TRAY) ×2 IMPLANT
KIT ROOM TURNOVER OR (KITS) ×2 IMPLANT
MANIFOLD NEPTUNE II (INSTRUMENTS) IMPLANT
NEEDLE HYPO 25GX1X1/2 BEV (NEEDLE) ×2 IMPLANT
NS IRRIG 1000ML POUR BTL (IV SOLUTION) ×2 IMPLANT
PACK TOTAL JOINT (CUSTOM PROCEDURE TRAY) ×2 IMPLANT
PAD ARMBOARD 7.5X6 YLW CONV (MISCELLANEOUS) ×4 IMPLANT
PLATE CLAVICLE LEFT 8 HOLE (Plate) ×2 IMPLANT
PLATE LOCKING 8H LEFT (Plate) ×2 IMPLANT
SCREW HEXALOBE LOCKING 3.5X14M (Screw) ×4 IMPLANT
SCREW HEXALOBE LOCKING 3.5X16M (Screw) ×2 IMPLANT
SCREW HEXALOBE NON-LOCK 3.5X14 (Screw) ×2 IMPLANT
SCREW HEXALOBE NON-LOCK 3.5X16 (Screw) ×4 IMPLANT
SCREW NON LOCKING HEX 3.5X18MM (Screw) ×2 IMPLANT
SLING ARM FOAM STRAP LRG (SOFTGOODS) ×2 IMPLANT
SPONGE LAP 18X18 X RAY DECT (DISPOSABLE) IMPLANT
STRIP CLOSURE SKIN 1/2X4 (GAUZE/BANDAGES/DRESSINGS) IMPLANT
SUCTION FRAZIER TIP 10 FR DISP (SUCTIONS) ×2 IMPLANT
SUT ETHILON 3 0 PS 1 (SUTURE) IMPLANT
SUT MNCRL AB 3-0 PS2 18 (SUTURE) ×2 IMPLANT
SUT MNCRL AB 4-0 PS2 18 (SUTURE) IMPLANT
SUT PROLENE 3 0 PS 1 (SUTURE) IMPLANT
SUT VIC AB 0 CT1 27 (SUTURE) ×1
SUT VIC AB 0 CT1 27XBRD ANBCTR (SUTURE) ×1 IMPLANT
SUT VIC AB 2-0 CT1 27 (SUTURE) ×1
SUT VIC AB 2-0 CT1 TAPERPNT 27 (SUTURE) ×1 IMPLANT
SUT VIC AB 2-0 CT3 27 (SUTURE) IMPLANT
SYR CONTROL 10ML LL (SYRINGE) ×2 IMPLANT
WATER STERILE IRR 1000ML POUR (IV SOLUTION) IMPLANT
WIRE TACK PLATE PL-PTACK (WIRE) ×2 IMPLANT
YANKAUER SUCT BULB TIP NO VENT (SUCTIONS) ×2 IMPLANT

## 2013-10-14 NOTE — Anesthesia Procedure Notes (Signed)
Procedure Name: Intubation Date/Time: 10/14/2013 1:05 PM Performed by: Darcey Nora B Pre-anesthesia Checklist: Patient identified, Emergency Drugs available, Suction available and Patient being monitored Patient Re-evaluated:Patient Re-evaluated prior to inductionOxygen Delivery Method: Circle system utilized Preoxygenation: Pre-oxygenation with 100% oxygen Intubation Type: IV induction Ventilation: Mask ventilation without difficulty Laryngoscope Size: Mac and 3 Grade View: Grade I Tube size: 7.5 mm Number of attempts: 1 Airway Equipment and Method: Stylet Placement Confirmation: ETT inserted through vocal cords under direct vision,  breath sounds checked- equal and bilateral and positive ETCO2 Secured at: 22 (cm at teeth) cm Tube secured with: Tape (cloth adhesive folded on itself around back of head and stuck to ETT) Dental Injury: Teeth and Oropharynx as per pre-operative assessment  Comments: maskable despite full beard

## 2013-10-14 NOTE — Brief Op Note (Signed)
10/14/2013  3:11 PM  PATIENT:  [name] [name]  43 y.o. male  PRE-OPERATIVE DIAGNOSIS:  left clavicle fracture  POST-OPERATIVE DIAGNOSIS:  left clavicle fracture  PROCEDURE:  Procedure(s): OPEN REDUCTION INTERNAL FIXATION (ORIF) CLAVICULAR FRACTURE (Left) with Acumed plate  SURGEON:  Surgeon(s) and Role:    * Budd Palmer, MD - Primary  PHYSICIAN ASSISTANT: RNFA  ANESTHESIA:   general  I/O:  Total I/O In: 1050 [I.V.:1050] Out: 50 [Blood:50]  SPECIMEN:  No Specimen  TOURNIQUET:  * No tourniquets in log *  DICTATION: .Other Dictation: Dictation Number [id number]
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
## 2013-10-14 NOTE — Transfer of Care (Signed)
Immediate Anesthesia Transfer of Care Note  Patient: [name] [name]  Procedure(s) Performed: Procedure(s): OPEN REDUCTION INTERNAL FIXATION (ORIF) CLAVICULAR FRACTURE (Left)  Patient Location: PACU  Anesthesia Type:General  Level of Consciousness: awake and patient cooperative  Airway & Oxygen Therapy: Patient Spontanous Breathing and Patient connected to nasal cannula oxygen  Post-op Assessment: Report given to PACU RN, Post -op Vital signs reviewed and stable and Patient moving all extremities  Post vital signs: Reviewed and stable  Complications: No apparent anesthesia complications

## 2013-10-14 NOTE — Discharge Instructions (Signed)
Patient is able to have arm elevated on 2-3 pillows.  Sling to be worn when upright.  What to eat:  For your first meals, you should eat lightly; only small meals initially.  If you do not have nausea, you may eat larger meals.  Avoid spicy, greasy and heavy food.    General Anesthesia, Adult, Care After  Refer to this sheet in the next few weeks. These instructions provide you with information on caring for yourself after your procedure. Your health care provider may also give you more specific instructions. Your treatment has been planned according to current medical practices, but problems sometimes occur. Call your health care provider if you have any problems or questions after your procedure.  WHAT TO EXPECT AFTER THE PROCEDURE  After the procedure, it is typical to experience:  Sleepiness.  Nausea and vomiting. HOME CARE INSTRUCTIONS  For the first 24 hours after general anesthesia:  Have a responsible person with you.  Do not drive a car. If you are alone, do not take public transportation.  Do not drink alcohol.  Do not take medicine that has not been prescribed by your health care provider.  Do not sign important papers or make important decisions.  You may resume a normal diet and activities as directed by your health care provider.  Change bandages (dressings) as directed.  If you have questions or problems that seem related to general anesthesia, call the hospital and ask for the anesthetist or anesthesiologist on call. SEEK MEDICAL CARE IF:  You have nausea and vomiting that continue the day after anesthesia.  You develop a rash. SEEK IMMEDIATE MEDICAL CARE IF:  You have difficulty breathing.  You have chest pain.  You have any allergic problems. Document Released: 05/05/2000 Document Revised: 09/29/2012 Document Reviewed: 08/12/2012  [facility] Patient Information 2014 [place], [place].

## 2013-10-14 NOTE — Progress Notes (Signed)
Dr. Katrinka Blazing made aware of PO intake

## 2013-10-14 NOTE — Anesthesia Postprocedure Evaluation (Signed)
  Anesthesia Post-op Note  Patient: [name] [name]  Procedure(s) Performed: Procedure(s): OPEN REDUCTION INTERNAL FIXATION (ORIF) CLAVICULAR FRACTURE (Left)  Patient Location: PACU  Anesthesia Type:General  Level of Consciousness: awake, alert , oriented and patient cooperative  Airway and Oxygen Therapy: Patient Spontanous Breathing  Post-op Pain: mild  Post-op Assessment: Post-op Vital signs reviewed, Patient's Cardiovascular Status Stable, Respiratory Function Stable, Patent Airway and No signs of Nausea or vomiting  Post-op Vital Signs: stable  Last Vitals:  Filed Vitals:   10/14/13 1658  BP: 125/90  Pulse:   Temp: 36.3 C  Resp:     Complications: No apparent anesthesia complications
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
## 2013-10-18 ENCOUNTER — Encounter (HOSPITAL_COMMUNITY): Payer: Self-pay | Admitting: Orthopedic Surgery

## 2013-11-04 DIAGNOSIS — S069X9S Unspecified intracranial injury with loss of consciousness of unspecified duration, sequela: Secondary | ICD-10-CM

## 2013-11-04 DIAGNOSIS — X58XXXA Exposure to other specified factors, initial encounter: Secondary | ICD-10-CM

## 2013-11-04 DIAGNOSIS — S069XAS Unspecified intracranial injury with loss of consciousness status unknown, sequela: Secondary | ICD-10-CM

## 2013-11-17 DIAGNOSIS — X58XXXA Exposure to other specified factors, initial encounter: Secondary | ICD-10-CM

## 2013-11-17 DIAGNOSIS — R413 Other amnesia: Secondary | ICD-10-CM

## 2013-11-17 DIAGNOSIS — S069X9S Unspecified intracranial injury with loss of consciousness of unspecified duration, sequela: Secondary | ICD-10-CM

## 2014-01-16 ENCOUNTER — Ambulatory Visit: Payer: BC Managed Care – PPO | Attending: Neurosurgery | Admitting: Physical Therapy

## 2014-01-16 ENCOUNTER — Encounter: Payer: Self-pay | Admitting: Physical Therapy

## 2014-01-16 DIAGNOSIS — H8111 Benign paroxysmal vertigo, right ear: Secondary | ICD-10-CM | POA: Insufficient documentation
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
## 2014-01-16 NOTE — Patient Instructions (Signed)
Benign Positional Vertigo Vertigo means you feel like you or your surroundings are moving when they are not. Benign positional vertigo is the most common form of vertigo. Benign means that the cause of your condition is not serious. Benign positional vertigo is more common in older adults. CAUSES  Benign positional vertigo is the result of an upset in the labyrinth system. This is an area in the middle ear that helps control your balance. This may be caused by a viral infection, head injury, or repetitive motion. However, often no specific cause is found. SYMPTOMS  Symptoms of benign positional vertigo occur when you move your head or eyes in different directions. Some of the symptoms may include:  Loss of balance and falls.  Vomiting.  Blurred vision.  Dizziness.  Nausea.  Involuntary eye movements (nystagmus). DIAGNOSIS  Benign positional vertigo is usually diagnosed by physical exam. If the specific cause of your benign positional vertigo is unknown, your caregiver may perform imaging tests, such as magnetic resonance imaging (MRI) or computed tomography (CT). TREATMENT  Your caregiver may recommend movements or procedures to correct the benign positional vertigo. Medicines such as meclizine, benzodiazepines, and medicines for nausea may be used to treat your symptoms. In rare cases, if your symptoms are caused by certain conditions that affect the inner ear, you may need surgery. HOME CARE INSTRUCTIONS   Follow your caregiver's instructions.  Move slowly. Do not make sudden body or head movements.  Avoid driving.  Avoid operating heavy machinery.  Avoid performing any tasks that would be dangerous to you or others during a vertigo episode.  Drink enough fluids to keep your urine clear or pale yellow. SEEK IMMEDIATE MEDICAL CARE IF:   You develop problems with walking, weakness, numbness, or using your arms, hands, or legs.  You have difficulty speaking.  You develop  severe headaches.  Your nausea or vomiting continues or gets worse.  You develop visual changes.  Your family or friends notice any behavioral changes.  Your condition gets worse.  You have a fever.  You develop a stiff neck or sensitivity to light. MAKE SURE YOU:   Understand these instructions.  Will watch your condition.  Will get help right away if you are not doing well or get worse. Document Released: 11/04/2005 Document Revised: 04/21/2011 Document Reviewed: 10/17/2010 ExitCare Patient Information 2015 [employer]. This information is not intended to replace advice given to you by your health care provider. Make sure you discuss any questions you have with your health care provider.    

## 2014-01-30 ENCOUNTER — Ambulatory Visit: Payer: BC Managed Care – PPO | Admitting: Physical Therapy

## 2015-04-28 ENCOUNTER — Other Ambulatory Visit (HOSPITAL_COMMUNITY)
Admission: RE | Admit: 2015-04-28 | Discharge: 2015-04-28 | Disposition: A | Discharge status: Home / Self Care | Payer: BLUE CROSS/BLUE SHIELD | Source: Ambulatory Visit | Attending: Family Medicine | Admitting: Family Medicine

## 2015-04-28 ENCOUNTER — Encounter (HOSPITAL_COMMUNITY): Payer: Self-pay | Admitting: *Deleted

## 2015-04-28 ENCOUNTER — Emergency Department (HOSPITAL_COMMUNITY)
Admission: EM | Admit: 2015-04-28 | Discharge: 2015-04-28 | Disposition: A | Discharge status: Home / Self Care | Payer: BLUE CROSS/BLUE SHIELD | Source: Home / Self Care | Attending: Family Medicine | Admitting: Family Medicine

## 2015-04-28 DIAGNOSIS — J040 Acute laryngitis: Secondary | ICD-10-CM | POA: Insufficient documentation

## 2015-04-28 DIAGNOSIS — J029 Acute pharyngitis, unspecified: Secondary | ICD-10-CM

## 2015-04-28 DIAGNOSIS — R0982 Postnasal drip: Secondary | ICD-10-CM

## 2015-04-28 LAB — POCT RAPID STREP A: Streptococcus, Group A Screen (Direct): NEGATIVE

## 2015-04-28 NOTE — Discharge Instructions (Signed)
Pharyngitis Ibuprofen 6 mg every 6 hours as needed for sore throat discomfort. Cepacol lozenges for sore throat discomfort. Drink plenty of cool clear liquids. For drainage. May take Claritin, Zyrtec or Allegra. There are no signs on physical exam that her airway is closing up or that you have epiglottitis. Pharyngitis is redness, pain, and swelling (inflammation) of your pharynx.  CAUSES  Pharyngitis is usually caused by infection. Most of the time, these infections are from viruses (viral) and are part of a cold. However, sometimes pharyngitis is caused by bacteria (bacterial). Pharyngitis can also be caused by allergies. Viral pharyngitis may be spread from person to person by coughing, sneezing, and personal items or utensils (cups, forks, spoons, toothbrushes). Bacterial pharyngitis may be spread from person to person by more intimate contact, such as kissing.  SIGNS AND SYMPTOMS  Symptoms of pharyngitis include:   Sore throat.   Tiredness (fatigue).   Low-grade fever.   Headache.  Joint pain and muscle aches.  Skin rashes.  Swollen lymph nodes.  Plaque-like film on throat or tonsils (often seen with bacterial pharyngitis). DIAGNOSIS  Your health care provider will ask you questions about your illness and your symptoms. Your medical history, along with a physical exam, is often all that is needed to diagnose pharyngitis. Sometimes, a rapid strep test is done. Other lab tests may also be done, depending on the suspected cause.  TREATMENT  Viral pharyngitis will usually get better in 3-4 days without the use of medicine. Bacterial pharyngitis is treated with medicines that kill germs (antibiotics).  HOME CARE INSTRUCTIONS   Drink enough water and fluids to keep your urine clear or pale yellow.   Only take over-the-counter or prescription medicines as directed by your health care provider:   If you are prescribed antibiotics, make sure you finish them even if you start to  feel better.   Do not take aspirin.   Get lots of rest.   Gargle with 8 oz of salt water ( tsp of salt per 1 qt of water) as often as every 1-2 hours to soothe your throat.   Throat lozenges (if you are not at risk for choking) or sprays may be used to soothe your throat. SEEK MEDICAL CARE IF:   You have large, tender lumps in your neck.  You have a rash.  You cough up green, yellow-brown, or bloody spit. SEEK IMMEDIATE MEDICAL CARE IF:   Your neck becomes stiff.  You drool or are unable to swallow liquids.  You vomit or are unable to keep medicines or liquids down.  You have severe pain that does not go away with the use of recommended medicines.  You have trouble breathing (not caused by a stuffy nose). MAKE SURE YOU:   Understand these instructions.  Will watch your condition.  Will get help right away if you are not doing well or get worse.   This information is not intended to replace advice given to you by your health care provider. Make sure you discuss any questions you have with your health care provider.   Document Released: 01/27/2005 Document Revised: 11/17/2012 Document Reviewed: 10/04/2012 Elsevier Interactive Patient Education 2016 Elsevier Inc.  Laryngitis Laryngitis is inflammation of your vocal cords. This causes hoarseness, coughing, loss of voice, sore throat, or a dry throat. Your vocal cords are two bands of muscles that are found in your throat. When you speak, these cords come together and vibrate. These vibrations come out through your mouth as sound. When  your vocal cords are inflamed, your voice sounds different. Laryngitis can be temporary (acute) or long-term (chronic). Most cases of acute laryngitis improve with time. Chronic laryngitis is laryngitis that lasts for more than three weeks. CAUSES Acute laryngitis may be caused by:  A viral infection.  Lots of talking, yelling, or singing. This is also called vocal strain.  Bacterial  infections. Chronic laryngitis may be caused by:  Vocal strain.  Injury to your vocal cords.  Acid reflux (gastroesophageal reflux disease or GERD).  Allergies.  Sinus infection.  Smoking.  Alcohol abuse.  Breathing in chemicals or dust.  Growths on the vocal cords. RISK FACTORS Risk factors for laryngitis include:  Smoking.  Alcohol abuse.  Having allergies. SIGNS AND SYMPTOMS Symptoms of laryngitis may include:  Low, hoarse voice.  Loss of voice.  Dry cough.  Sore throat.  Stuffy nose. DIAGNOSIS Laryngitis may be diagnosed by:  Physical exam.  Throat culture.  Blood test.  Laryngoscopy. This procedure allows your health care provider to look at your vocal cords with a mirror or viewing tube. TREATMENT Treatment for laryngitis depends on what is causing it. Usually, treatment involves resting your voice and using medicines to soothe your throat. However, if your laryngitis is caused by a bacterial infection, you may need to take antibiotic medicine. If your laryngitis is caused by a growth, you may need to have a procedure to remove it. HOME CARE INSTRUCTIONS  Drink enough fluid to keep your urine clear or pale yellow.  Breathe in moist air. Use a humidifier if you live in a dry climate.  Take medicines only as directed by your health care provider.  If you were prescribed an antibiotic medicine, finish it all even if you start to feel better.  Do not smoke cigarettes or electronic cigarettes. If you need help quitting, ask your health care provider.  Talk as little as possible. Also avoid whispering, which can cause vocal strain.  Write instead of talking. Do this until your voice is back to normal. SEEK MEDICAL CARE IF:  You have a fever.  You have increasing pain.  You have difficulty swallowing. SEEK IMMEDIATE MEDICAL CARE IF:  You cough up blood.  You have trouble breathing.   This information is not intended to replace advice  given to you by your health care provider. Make sure you discuss any questions you have with your health care provider.   Document Released: 01/27/2005 Document Revised: 02/17/2014 Document Reviewed: 07/12/2013 Elsevier Interactive Patient Education 2016 Elsevier Inc.  Strep Throat Strep throat is a bacterial infection of the throat. Your health care provider may call the infection tonsillitis or pharyngitis, depending on whether there is swelling in the tonsils or at the back of the throat. Strep throat is most common during the cold months of the year in children who are 15-45 years of age, but it can happen during any season in people of any age. This infection is spread from person to person (contagious) through coughing, sneezing, or close contact. CAUSES Strep throat is caused by the bacteria called Streptococcus pyogenes. RISK FACTORS This condition is more likely to develop in:  People who spend time in crowded places where the infection can spread easily.  People who have close contact with someone who has strep throat. SYMPTOMS Symptoms of this condition include:  Fever or chills.   Redness, swelling, or pain in the tonsils or throat.  Pain or difficulty when swallowing.  White or yellow spots on the tonsils  or throat.  Swollen, tender glands in the neck or under the jaw.  Red rash all over the body (rare). DIAGNOSIS This condition is diagnosed by performing a rapid strep test or by taking a swab of your throat (throat culture test). Results from a rapid strep test are usually ready in a few minutes, but throat culture test results are available after one or two days. TREATMENT This condition is treated with antibiotic medicine. HOME CARE INSTRUCTIONS Medicines  Take over-the-counter and prescription medicines only as told by your health care provider.  Take your antibiotic as told by your health care provider. Do not stop taking the antibiotic even if you start to  feel better.  Have family members who also have a sore throat or fever tested for strep throat. They may need antibiotics if they have the strep infection. Eating and Drinking  Do not share food, drinking cups, or personal items that could cause the infection to spread to other people.  If swallowing is difficult, try eating soft foods until your sore throat feels better.  Drink enough fluid to keep your urine clear or pale yellow. General Instructions  Gargle with a salt-water mixture 3-4 times per day or as needed. To make a salt-water mixture, completely dissolve -1 tsp of salt in 1 cup of warm water.  Make sure that all household members wash their hands well.  Get plenty of rest.  Stay home from school or work until you have been taking antibiotics for 24 hours.  Keep all follow-up visits as told by your health care provider. This is important. SEEK MEDICAL CARE IF:  The glands in your neck continue to get bigger.  You develop a rash, cough, or earache.  You cough up a thick liquid that is green, yellow-brown, or bloody.  You have pain or discomfort that does not get better with medicine.  Your problems seem to be getting worse rather than better.  You have a fever. SEEK IMMEDIATE MEDICAL CARE IF:  You have new symptoms, such as vomiting, severe headache, stiff or painful neck, chest pain, or shortness of breath.  You have severe throat pain, drooling, or changes in your voice.  You have swelling of the neck, or the skin on the neck becomes red and tender.  You have signs of dehydration, such as fatigue, dry mouth, and decreased urination.  You become increasingly sleepy, or you cannot wake up completely.  Your joints become red or painful.   This information is not intended to replace advice given to you by your health care provider. Make sure you discuss any questions you have with your health care provider.   Document Released: 01/25/2000 Document Revised:  10/18/2014 Document Reviewed: 05/22/2014 Elsevier Interactive Patient Education [employer]

## 2015-04-28 NOTE — ED Notes (Signed)
C/O of a slight soreness and a "tightness" in throat, where he feels "very aware of my epiglottis".  Denies any breathing or swallowing difficulty, "but it doesn't feel right in epiglottis".  Had flu sxs 2 days ago with chills and body aches that resolved.  Woke up this AM with throat sxs.  Has taken IBU and been using throat lozenges.

## 2015-04-28 NOTE — ED Provider Notes (Signed)
CSN: [id number]     Arrival date & time 04/28/15  1910 History   First MD Initiated Contact with Patient 04/28/15 2006     Chief Complaint  Patient presents with  . Sore Throat   (Consider location/radiation/quality/duration/timing/severity/associated sxs/prior Treatment) HPI Comments: 45 year old male states that yesterday he had some scratching in his throat and this morning he states that he believes he feels his epiglottis and the back of his throat. This means that he feels like it might be swollen or irritated. He does have PND. He also has pharyngeal erythema. Denies shortness of breath or problems breathing. Denies unusual or abnormal airway sounds. Denies fever or chills.   Past Medical History  Diagnosis Date  . Pneumonia     as a teenager  . Headache(784.0)     since TBI  . Traumatic brain injury ([facility])   . Clavicle fracture     left   Past Surgical History  Procedure Laterality Date  . Wisdom tooth extraction    . Orif clavicular fracture Left 10/14/2013    Procedure: OPEN REDUCTION INTERNAL FIXATION (ORIF) CLAVICULAR FRACTURE;  Surgeon: Budd Palmer, MD;  Location: [facility] OR;  Service: Orthopedics;  Laterality: Left;   Family History  Problem Relation Age of Onset  . Hypertension Mother   . Hypertension Father    Social History  Substance Use Topics  . Smoking status: Former Smoker -- 1.00 packs/day    Types: Cigarettes    Quit date: 10/13/1993  . Smokeless tobacco: Never Used  . Alcohol Use: Yes     Comment: occasional    Review of Systems  Constitutional: Negative for fever, chills and activity change.  HENT: Positive for congestion, postnasal drip, sore throat and voice change. Negative for ear pain and facial swelling.   Respiratory: Negative for chest tightness and shortness of breath.   Cardiovascular: Negative for chest pain.  Gastrointestinal: Negative.   Neurological: Negative.     Allergies  Review of patient's allergies indicates no known  allergies.  Home Medications   Prior to Admission medications   Medication Sig Start Date End Date Taking? Authorizing Provider  carbamide peroxide (DEBROX) 6.5 % otic solution Place 5 drops into the left ear 2 (two) times daily. 10/04/13   Freeman Caldron, PA-C  HYDROcodone-acetaminophen (NORCO) 7.5-325 MG per tablet Take 1-2 tablets by mouth every 6 (six) hours as needed for moderate pain (every 4-6 hours).    Historical Provider, MD  ondansetron (ZOFRAN) 4 MG tablet Take 1 tablet (4 mg total) by mouth every 6 (six) hours as needed for nausea or vomiting. 10/14/13   Myrene Galas, MD  ondansetron (ZOFRAN) 4 MG tablet Take 1 tablet (4 mg total) by mouth every 8 (eight) hours as needed for nausea or vomiting. 10/14/13   Myrene Galas, MD   Meds Ordered and Administered this Visit  Medications - No data to display  BP 173/102 mmHg  Pulse 90  Temp(Src) 98.3 F (36.8 C) (Oral)  Resp 14  SpO2 100% No data found.   Physical Exam  Constitutional: He is oriented to person, place, and time. He appears well-developed and well-nourished. No distress.  HENT:  Mouth/Throat: No oropharyngeal exudate.  Oropharynx with minor erythema of the soft palate and posterior pharynx. Positive for clear PND. No swelling. No petechia. The posterior pharynx is well visualized. No exudates, foreign bodies or lesions. Airway is widely patent. Examiner's able to see the base of the tongue but not quite the epiglottis. Auscultation of  the base of the anterior neck reveals no abnormal airway sounds, no stridor. Air movement is brisk and and out.  Eyes: EOM are normal.  Neck: Normal range of motion. Neck supple.  Cardiovascular: Normal rate and normal heart sounds.   Pulmonary/Chest: Effort normal. No respiratory distress. He has no wheezes. He has no rales.  Musculoskeletal: He exhibits no edema.  Lymphadenopathy:    He has no cervical adenopathy.  Neurological: He is alert and oriented to person, place, and time.  He exhibits normal muscle tone.  Skin: Skin is warm and dry.  Psychiatric: He has a normal mood and affect.  Nursing note and vitals reviewed.   ED Course  Procedures (including critical care time)  Labs Review Labs Reviewed  POCT RAPID STREP A   Results for orders placed or performed during the hospital encounter of 04/28/15  POCT rapid strep A [facility]  Result Value Ref Range   Streptococcus, Group A Screen (Direct) NEGATIVE NEGATIVE     Imaging Review No results found.   Visual Acuity Review  Right Eye Distance:   Left Eye Distance:   Bilateral Distance:    Right Eye Near:   Left Eye Near:    Bilateral Near:         MDM   1. Pharyngitis   2. PND (post-nasal drip)   3. Laryngitis    Ibuprofen 600 mg every 6 hours as needed for sore throat discomfort. Cepacol lozenges for sore throat discomfort. Drink plenty of cool clear liquids. For drainage. May take Claritin, Zyrtec or Allegra. There are no signs on physical exam that her airway is closing up or that you have epiglottitis.     Hayden Rasmussenavid Brita Jurgensen, NP 04/28/15 2055

## 2015-04-30 LAB — CULTURE, GROUP A STREP (THRC)

## 2015-05-01 ENCOUNTER — Emergency Department (INDEPENDENT_AMBULATORY_CARE_PROVIDER_SITE_OTHER)
Admission: EM | Admit: 2015-05-01 | Discharge: 2015-05-01 | Disposition: A | Discharge status: Home / Self Care | Payer: BLUE CROSS/BLUE SHIELD | Source: Home / Self Care | Attending: Family Medicine | Admitting: Family Medicine

## 2015-05-01 ENCOUNTER — Encounter (HOSPITAL_COMMUNITY): Payer: Self-pay | Admitting: Emergency Medicine

## 2015-05-01 DIAGNOSIS — J020 Streptococcal pharyngitis: Secondary | ICD-10-CM | POA: Diagnosis not present

## 2015-05-01 MED ORDER — AMOXICILLIN 250 MG PO CAPS: 250.0000 mg | ORAL_CAPSULE | Freq: Three times a day (TID) | ORAL | Status: AC

## 2015-05-01 NOTE — ED Notes (Signed)
[facility] a [facility] and the rapid strep came back negative but he is not reporting worsening symptoms.

## 2015-05-01 NOTE — Discharge Instructions (Signed)

## 2015-05-02 ENCOUNTER — Telehealth (HOSPITAL_COMMUNITY): Payer: Self-pay | Admitting: Emergency Medicine

## 2015-05-02 NOTE — ED Provider Notes (Signed)
CSN: [id number]     Arrival date & time 05/01/15  1430 History   First MD Initiated Contact with Patient 05/01/15 1643     Chief Complaint  Patient presents with  . Sore Throat   (Consider location/radiation/quality/duration/timing/severity/associated sxs/prior Treatment) HPI Pt was seen 3/18 ER for concerns that he had epiglottitis. States he had read about his symptoms on google. States he has 2 white spots on his uvula., and continues to have a sore throat. Throat culture was done. Home treatment has been symptomatic. Past Medical History  Diagnosis Date  . Pneumonia     as a teenager  . Headache(784.0)     since TBI  . Traumatic brain injury (HCC)   . Clavicle fracture     left   Past Surgical History  Procedure Laterality Date  . Wisdom tooth extraction    . Orif clavicular fracture Left 10/14/2013    Procedure: OPEN REDUCTION INTERNAL FIXATION (ORIF) CLAVICULAR FRACTURE;  Surgeon: Budd PalmerMichael H Handy, MD;  Location: [facility] OR;  Service: Orthopedics;  Laterality: Left;   Family History  Problem Relation Age of Onset  . Hypertension Mother   . Hypertension Father    Social History  Substance Use Topics  . Smoking status: Former Smoker -- 1.00 packs/day    Types: Cigarettes    Quit date: 10/13/1993  . Smokeless tobacco: Never Used  . Alcohol Use: Yes     Comment: occasional    Review of Systems Sore throat Allergies  Review of patient's allergies indicates no known allergies.  Home Medications   Prior to Admission medications   Medication Sig Start Date End Date Taking? Authorizing Provider  amoxicillin (AMOXIL) 250 MG capsule Take 1 capsule (250 mg total) by mouth 3 (three) times daily. 05/01/15   Tharon AquasFrank C Decarlo Rivet, PA  carbamide peroxide (DEBROX) 6.5 % otic solution Place 5 drops into the left ear 2 (two) times daily. 10/04/13   Freeman CaldronMichael J Jeffery, PA-C  HYDROcodone-acetaminophen (NORCO) 7.5-325 MG per tablet Take 1-2 tablets by mouth every 6 (six) hours as needed for  moderate pain (every 4-6 hours).    Historical Provider, MD  ondansetron (ZOFRAN) 4 MG tablet Take 1 tablet (4 mg total) by mouth every 6 (six) hours as needed for nausea or vomiting. 10/14/13   Myrene GalasMichael Handy, MD  ondansetron (ZOFRAN) 4 MG tablet Take 1 tablet (4 mg total) by mouth every 8 (eight) hours as needed for nausea or vomiting. 10/14/13   Myrene GalasMichael Handy, MD   Meds Ordered and Administered this Visit  Medications - No data to display  BP 148/95 mmHg  Pulse 67  Temp(Src) 97.7 F (36.5 C) (Oral)  Resp 16  SpO2 100% No data found.   Physical Exam NURSES NOTES AND VITAL SIGNS REVIEWED. CONSTITUTIONAL: Well developed, well nourished, no acute distress HEENT: normocephalic, atraumatic, right and left TM's are normal, Uvula may appear slightly enlarged and there does appear to be a cyst noted at base of uvula.  EYES: Conjunctiva normal NECK:normal ROM, supple, no adenopathy PULMONARY:No respiratory distress, normal effort, Lungs: CTAb/l, no wheezes, or increased work of breathing CARDIOVASCULAR: RRR, no murmur ABDOMEN: soft, ND, NT, +'ve BS MUSCULOSKELETAL: Normal ROM of all extremities,  SKIN: warm and dry without rash PSYCHIATRIC: Mood and affect, behavior are normal  ED Course  Procedures (including critical care time)  Labs Review Labs Reviewed - No data to display  Imaging Review No results found.   Visual Acuity Review  Right Eye Distance:   Left Eye  Distance:   Bilateral Distance:    Right Eye Near:   Left Eye Near:    Bilateral Near:     Review of culture     Culture ABUNDANT STREPTOCOCCUS,BETA HEMOLYTIC NOT GROUP A        Rx  Amoxil for 7 days  MDM   1. Pharyngitis, streptococcal, acute     Patient is reassured that there are no issues that require transfer to higher level of care at this time or additional tests. Patient is advised to continue home symptomatic treatment. Patient is advised that if there are new or worsening symptoms to attend the  emergency department, contact primary care provider, or return to [facility]. Instructions of care provided discharged home in stable condition. Return to work/school note provided.   THIS NOTE WAS GENERATED USING A VOICE RECOGNITION SOFTWARE PROGRAM. ALL REASONABLE EFFORTS  WERE MADE TO PROOFREAD THIS DOCUMENT FOR ACCURACY.  I have verbally reviewed the discharge instructions with the patient. A printed AVS was given to the patient.  All questions were answered prior to discharge.      Tharon Aquas, [place] 05/02/15 [phone]

## 2015-05-02 NOTE — ED Notes (Signed)
x1 attempt  LM on pt's VM [phone] and also LM w/father on [phone] Need to give lab results from recent visit on 3/18  Per Dr. Dayton ScrapeMurray,  Please let patient know that throat culture is positive for strep. [facility] Recheck for persistent sx's. LM  Will try later.

## 2015-05-04 NOTE — ED Notes (Signed)
x2 attempt  LM on pt's VM [phone] Need to give lab results from recent visit on 3/18  Per Dr. Dayton ScrapeMurray,  Please let patient know that throat culture is positive for strep. [facility] Recheck for persistent sx's. LM  Will try later.

## 2015-05-08 NOTE — ED Notes (Signed)
Mailed letter as x3 attempt  Per Dr. Dayton ScrapeMurray,  Please let patient know that throat culture is positive for strep. [facility] Recheck for persistent sx's. LM

## 2015-11-07 DIAGNOSIS — Z87820 Personal history of traumatic brain injury: Secondary | ICD-10-CM | POA: Diagnosis not present

## 2015-11-07 DIAGNOSIS — R569 Unspecified convulsions: Secondary | ICD-10-CM | POA: Diagnosis not present

## 2015-11-07 DIAGNOSIS — Z87891 Personal history of nicotine dependence: Secondary | ICD-10-CM | POA: Diagnosis not present

## 2015-11-07 DIAGNOSIS — Z043 Encounter for examination and observation following other accident: Secondary | ICD-10-CM | POA: Diagnosis not present

## 2015-11-12 DIAGNOSIS — Z23 Encounter for immunization: Secondary | ICD-10-CM | POA: Diagnosis not present

## 2015-12-04 DIAGNOSIS — F419 Anxiety disorder, unspecified: Secondary | ICD-10-CM | POA: Diagnosis not present

## 2015-12-04 DIAGNOSIS — R569 Unspecified convulsions: Secondary | ICD-10-CM | POA: Diagnosis not present

## 2015-12-04 DIAGNOSIS — Z87820 Personal history of traumatic brain injury: Secondary | ICD-10-CM | POA: Diagnosis not present

## 2015-12-04 DIAGNOSIS — R030 Elevated blood-pressure reading, without diagnosis of hypertension: Secondary | ICD-10-CM | POA: Diagnosis not present

## 2015-12-14 DIAGNOSIS — S069X5S Unspecified intracranial injury with loss of consciousness greater than 24 hours with return to pre-existing conscious level, sequela: Secondary | ICD-10-CM | POA: Diagnosis not present

## 2015-12-14 DIAGNOSIS — G40209 Localization-related (focal) (partial) symptomatic epilepsy and epileptic syndromes with complex partial seizures, not intractable, without status epilepticus: Secondary | ICD-10-CM | POA: Diagnosis not present

## 2015-12-14 DIAGNOSIS — R930 Abnormal findings on diagnostic imaging of skull and head, not elsewhere classified: Secondary | ICD-10-CM | POA: Diagnosis not present

## 2015-12-14 DIAGNOSIS — Z6828 Body mass index (BMI) 28.0-28.9, adult: Secondary | ICD-10-CM | POA: Diagnosis not present

## 2015-12-19 DIAGNOSIS — G40209 Localization-related (focal) (partial) symptomatic epilepsy and epileptic syndromes with complex partial seizures, not intractable, without status epilepticus: Secondary | ICD-10-CM | POA: Diagnosis not present

## 2016-01-25 DIAGNOSIS — Z6829 Body mass index (BMI) 29.0-29.9, adult: Secondary | ICD-10-CM | POA: Diagnosis not present

## 2016-01-25 DIAGNOSIS — S069X5S Unspecified intracranial injury with loss of consciousness greater than 24 hours with return to pre-existing conscious level, sequela: Secondary | ICD-10-CM | POA: Diagnosis not present

## 2016-01-25 DIAGNOSIS — R930 Abnormal findings on diagnostic imaging of skull and head, not elsewhere classified: Secondary | ICD-10-CM | POA: Diagnosis not present

## 2016-01-25 DIAGNOSIS — G40209 Localization-related (focal) (partial) symptomatic epilepsy and epileptic syndromes with complex partial seizures, not intractable, without status epilepticus: Secondary | ICD-10-CM | POA: Diagnosis not present

## 2016-05-22 DIAGNOSIS — S069X5S Unspecified intracranial injury with loss of consciousness greater than 24 hours with return to pre-existing conscious level, sequela: Secondary | ICD-10-CM | POA: Diagnosis not present

## 2016-05-22 DIAGNOSIS — G40209 Localization-related (focal) (partial) symptomatic epilepsy and epileptic syndromes with complex partial seizures, not intractable, without status epilepticus: Secondary | ICD-10-CM | POA: Diagnosis not present

## 2016-05-22 DIAGNOSIS — Z6829 Body mass index (BMI) 29.0-29.9, adult: Secondary | ICD-10-CM | POA: Diagnosis not present

## 2016-05-22 DIAGNOSIS — R930 Abnormal findings on diagnostic imaging of skull and head, not elsewhere classified: Secondary | ICD-10-CM | POA: Diagnosis not present

## 2016-06-15 IMAGING — CR DG CLAVICLE*L*
2 series · 2 of 2 positions shown · non-contrast
Comparison: 10/01/2013.

CLINICAL DATA: Pain .

EXAM:
LEFT CLAVICLE - 2+ VIEWS

[x clavicle ap left]
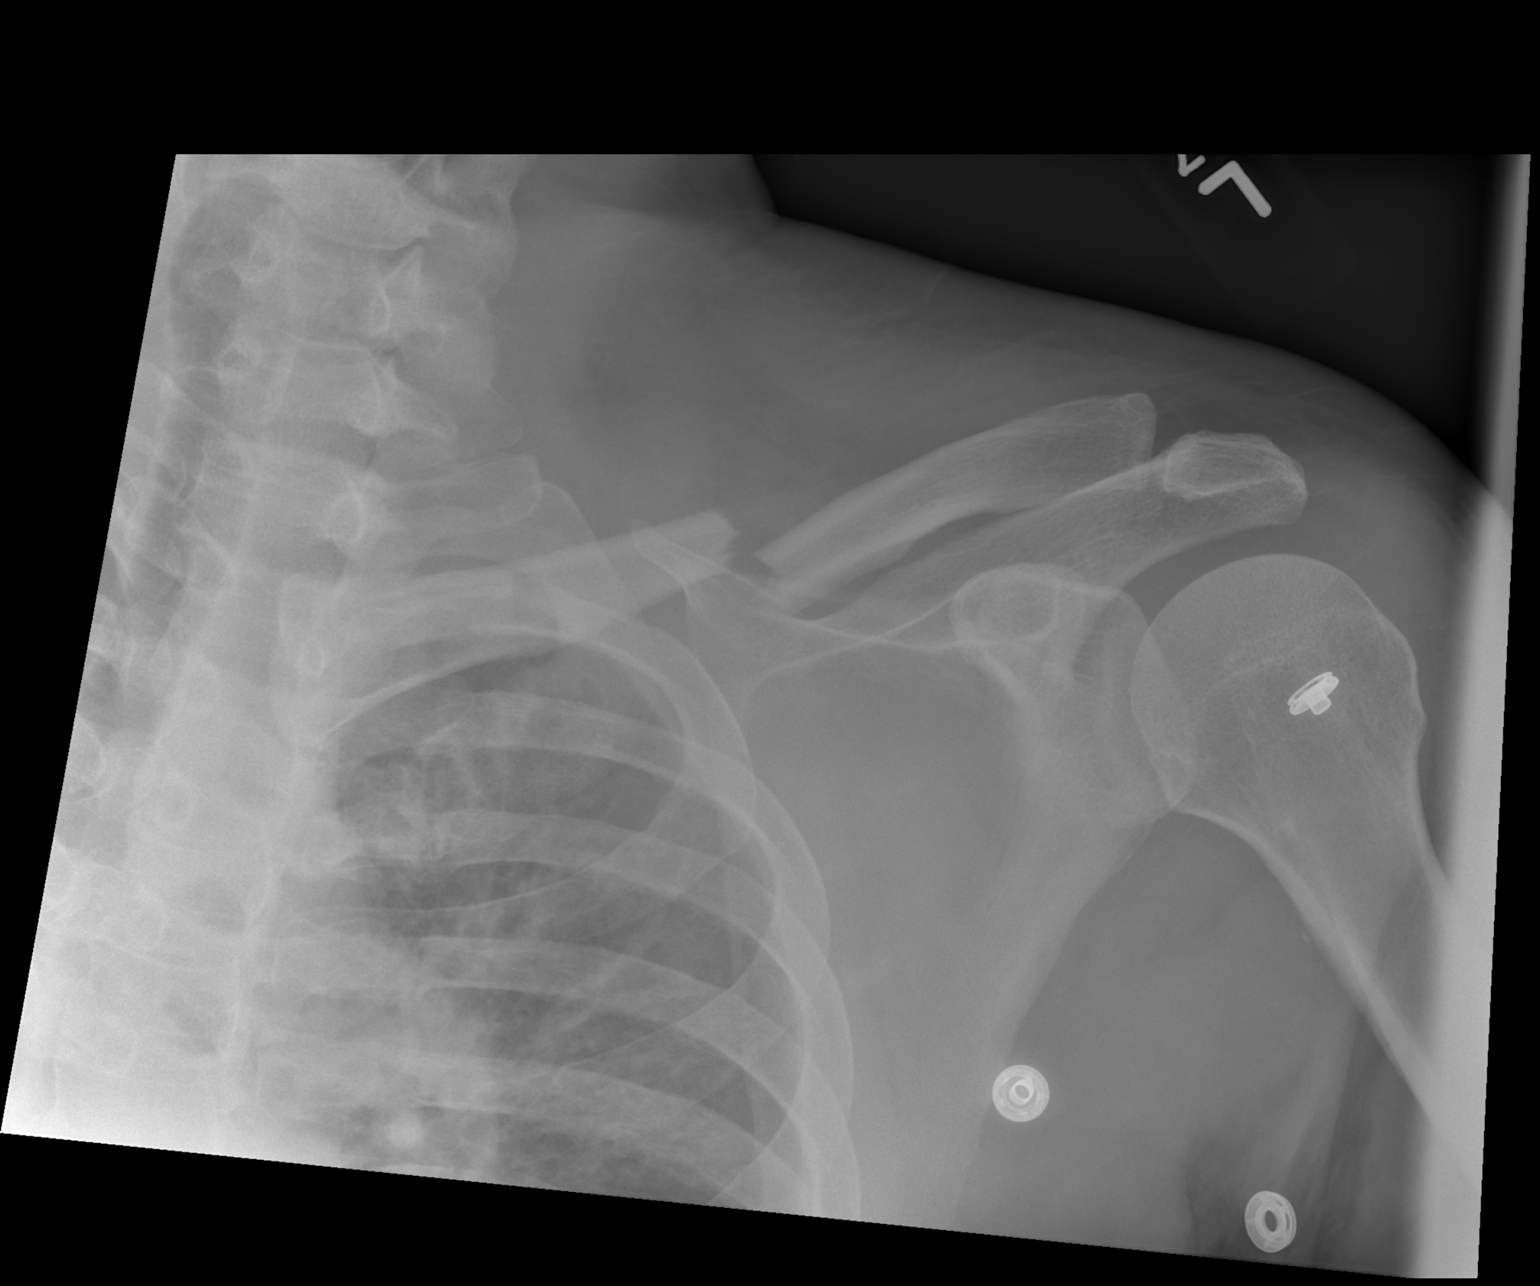

[x clavicle tangential left]
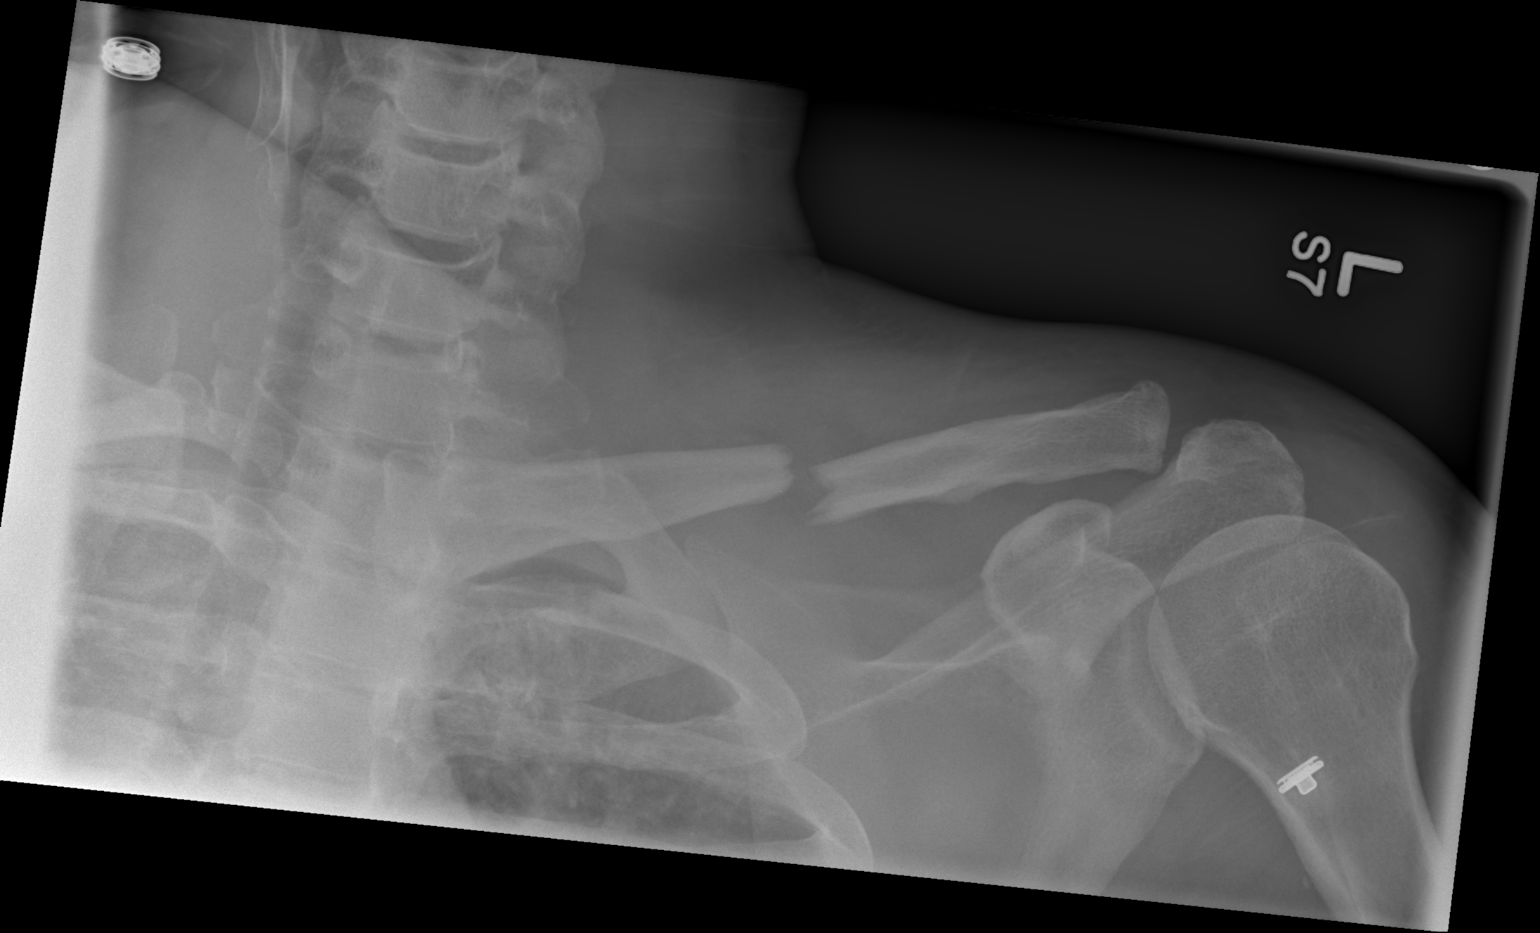

[2 of 2 positions shown; findings below may reference images not displayed]

FINDINGS: Displaced fracture of the midportion of the left clavicle again
noted. Displacement is slightly improved from prior exam. Similar
finding noted on prior study.
IMPRESSION: 1. Displaced fracture of the midportion of the left clavicle again
noted. Displacement is slightly improved from prior exam.
2. No other focal abnormality .

## 2016-06-25 IMAGING — RF DG CLAVICLE*L*
1 series · 3 of 3 positions shown · non-contrast
Comparison: 10/04/2013

CLINICAL DATA: Repair left clavicle

EXAM:
LEFT CLAVICLE - 2+ VIEWS; DG C-ARM 61-120 MIN

[Series 1: run · 3 of 3 slices shown]
[im 1/3]
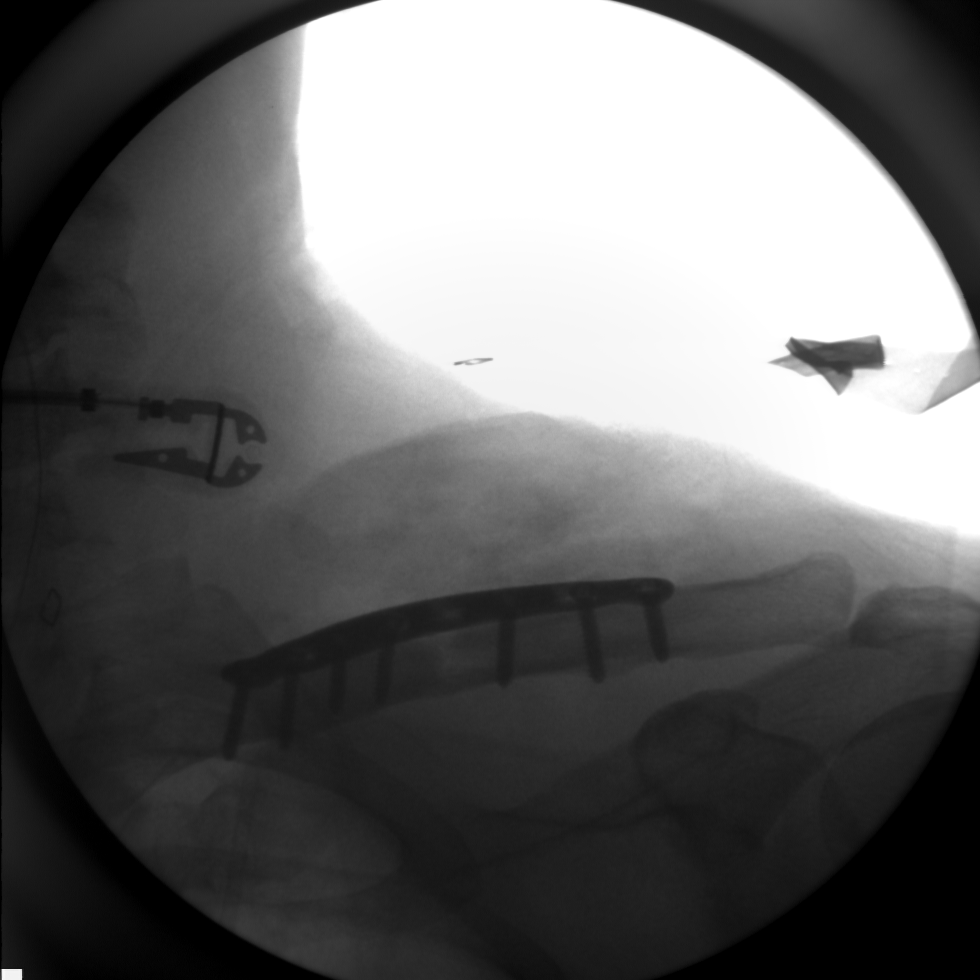
[im 2/3]
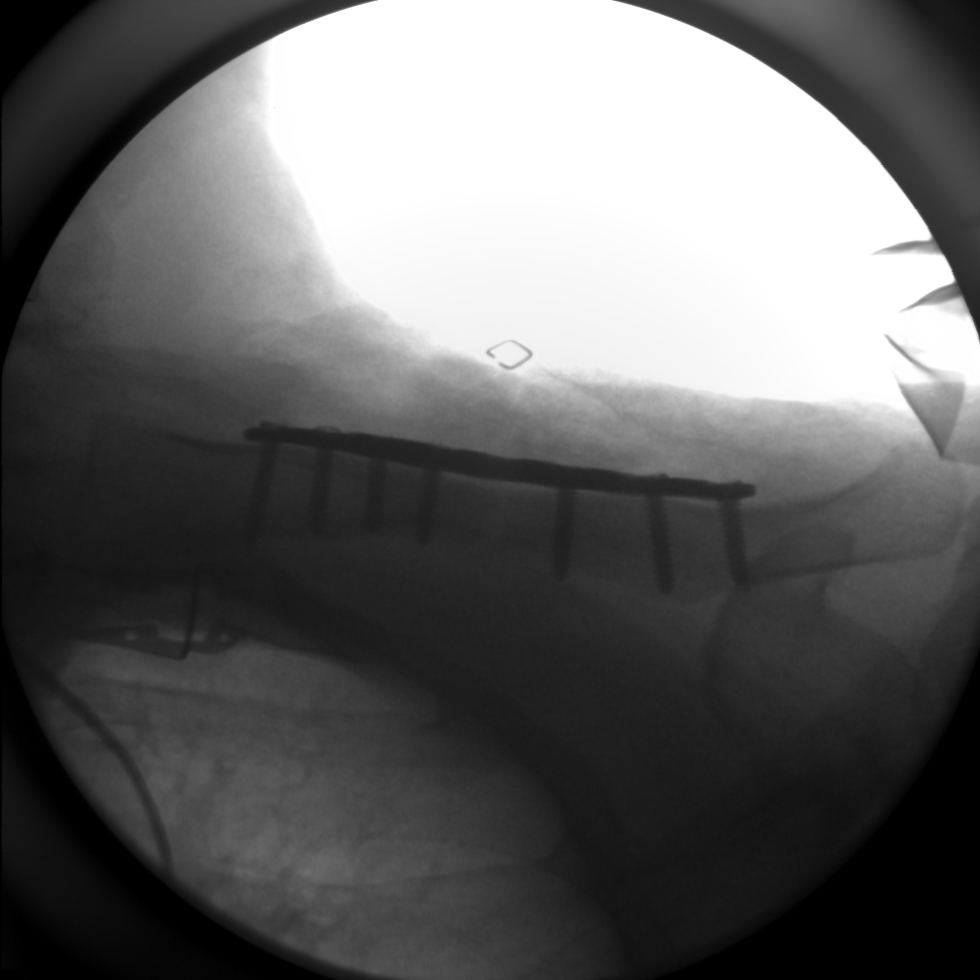
[im 3/3]
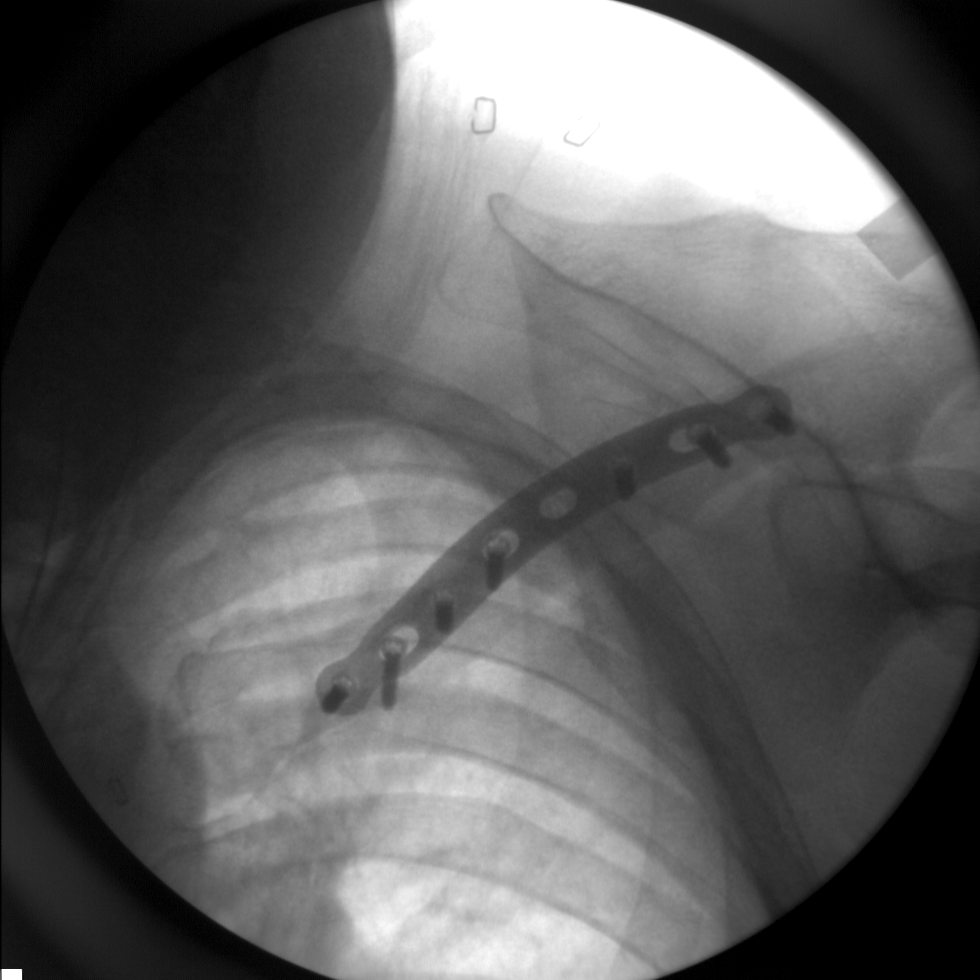

[3 of 3 positions shown; findings below may reference images not displayed]

FINDINGS: Compression plate has been placed across the cranial surface of the
clavicle. Fracture fragments now all and anatomic position and
alignment.
IMPRESSION: ORIF left clavicle fracture

## 2016-10-22 DIAGNOSIS — Z23 Encounter for immunization: Secondary | ICD-10-CM | POA: Diagnosis not present

## 2016-12-11 DIAGNOSIS — Z13220 Encounter for screening for lipoid disorders: Secondary | ICD-10-CM | POA: Diagnosis not present

## 2016-12-11 DIAGNOSIS — Z114 Encounter for screening for human immunodeficiency virus [HIV]: Secondary | ICD-10-CM | POA: Diagnosis not present

## 2016-12-11 DIAGNOSIS — Z1329 Encounter for screening for other suspected endocrine disorder: Secondary | ICD-10-CM | POA: Diagnosis not present

## 2016-12-11 DIAGNOSIS — Z0000 Encounter for general adult medical examination without abnormal findings: Secondary | ICD-10-CM | POA: Diagnosis not present

## 2016-12-17 DIAGNOSIS — Z6832 Body mass index (BMI) 32.0-32.9, adult: Secondary | ICD-10-CM | POA: Diagnosis not present

## 2016-12-17 DIAGNOSIS — Z0000 Encounter for general adult medical examination without abnormal findings: Secondary | ICD-10-CM | POA: Diagnosis not present

## 2016-12-17 DIAGNOSIS — Z23 Encounter for immunization: Secondary | ICD-10-CM | POA: Diagnosis not present

## 2017-01-13 DIAGNOSIS — Z23 Encounter for immunization: Secondary | ICD-10-CM | POA: Diagnosis not present

## 2017-01-25 DIAGNOSIS — J029 Acute pharyngitis, unspecified: Secondary | ICD-10-CM | POA: Diagnosis not present

## 2017-03-24 DIAGNOSIS — Z6832 Body mass index (BMI) 32.0-32.9, adult: Secondary | ICD-10-CM | POA: Diagnosis not present

## 2017-03-24 DIAGNOSIS — I10 Essential (primary) hypertension: Secondary | ICD-10-CM | POA: Diagnosis not present

## 2017-03-24 DIAGNOSIS — L989 Disorder of the skin and subcutaneous tissue, unspecified: Secondary | ICD-10-CM | POA: Diagnosis not present

## 2017-06-30 DIAGNOSIS — R930 Abnormal findings on diagnostic imaging of skull and head, not elsewhere classified: Secondary | ICD-10-CM | POA: Diagnosis not present

## 2017-06-30 DIAGNOSIS — G40209 Localization-related (focal) (partial) symptomatic epilepsy and epileptic syndromes with complex partial seizures, not intractable, without status epilepticus: Secondary | ICD-10-CM | POA: Diagnosis not present

## 2017-06-30 DIAGNOSIS — S069X5D Unspecified intracranial injury with loss of consciousness greater than 24 hours with return to pre-existing conscious level, subsequent encounter: Secondary | ICD-10-CM | POA: Diagnosis not present

## 2018-04-07 DIAGNOSIS — Z0000 Encounter for general adult medical examination without abnormal findings: Secondary | ICD-10-CM | POA: Diagnosis not present

## 2018-04-07 DIAGNOSIS — Z1329 Encounter for screening for other suspected endocrine disorder: Secondary | ICD-10-CM | POA: Diagnosis not present

## 2018-04-07 DIAGNOSIS — Z125 Encounter for screening for malignant neoplasm of prostate: Secondary | ICD-10-CM | POA: Diagnosis not present

## 2018-04-07 DIAGNOSIS — Z114 Encounter for screening for human immunodeficiency virus [HIV]: Secondary | ICD-10-CM | POA: Diagnosis not present

## 2018-04-07 DIAGNOSIS — Z13220 Encounter for screening for lipoid disorders: Secondary | ICD-10-CM | POA: Diagnosis not present

## 2018-04-13 DIAGNOSIS — Z0000 Encounter for general adult medical examination without abnormal findings: Secondary | ICD-10-CM | POA: Diagnosis not present

## 2018-04-13 DIAGNOSIS — R569 Unspecified convulsions: Secondary | ICD-10-CM | POA: Diagnosis not present

## 2018-04-13 DIAGNOSIS — E559 Vitamin D deficiency, unspecified: Secondary | ICD-10-CM | POA: Diagnosis not present

## 2018-04-13 DIAGNOSIS — I10 Essential (primary) hypertension: Secondary | ICD-10-CM | POA: Diagnosis not present

## 2018-04-13 DIAGNOSIS — Z23 Encounter for immunization: Secondary | ICD-10-CM | POA: Diagnosis not present

## 2018-04-13 DIAGNOSIS — Z6832 Body mass index (BMI) 32.0-32.9, adult: Secondary | ICD-10-CM | POA: Diagnosis not present

## 2018-04-13 DIAGNOSIS — E782 Mixed hyperlipidemia: Secondary | ICD-10-CM | POA: Diagnosis not present

## 2018-05-11 DIAGNOSIS — I10 Essential (primary) hypertension: Secondary | ICD-10-CM | POA: Diagnosis not present

## 2018-05-11 DIAGNOSIS — R739 Hyperglycemia, unspecified: Secondary | ICD-10-CM | POA: Diagnosis not present

## 2018-05-11 DIAGNOSIS — Z719 Counseling, unspecified: Secondary | ICD-10-CM | POA: Diagnosis not present

## 2018-05-11 DIAGNOSIS — E782 Mixed hyperlipidemia: Secondary | ICD-10-CM | POA: Diagnosis not present

## 2018-09-16 DIAGNOSIS — R739 Hyperglycemia, unspecified: Secondary | ICD-10-CM | POA: Diagnosis not present

## 2018-09-16 DIAGNOSIS — E782 Mixed hyperlipidemia: Secondary | ICD-10-CM | POA: Diagnosis not present

## 2018-09-22 DIAGNOSIS — R739 Hyperglycemia, unspecified: Secondary | ICD-10-CM | POA: Diagnosis not present

## 2018-09-22 DIAGNOSIS — E782 Mixed hyperlipidemia: Secondary | ICD-10-CM | POA: Diagnosis not present

## 2018-09-22 DIAGNOSIS — I10 Essential (primary) hypertension: Secondary | ICD-10-CM | POA: Diagnosis not present

## 2018-11-05 ENCOUNTER — Other Ambulatory Visit: Payer: Self-pay

## 2018-11-05 DIAGNOSIS — R930 Abnormal findings on diagnostic imaging of skull and head, not elsewhere classified: Secondary | ICD-10-CM | POA: Diagnosis not present

## 2018-11-05 DIAGNOSIS — J029 Acute pharyngitis, unspecified: Secondary | ICD-10-CM | POA: Diagnosis not present

## 2018-11-05 DIAGNOSIS — G40209 Localization-related (focal) (partial) symptomatic epilepsy and epileptic syndromes with complex partial seizures, not intractable, without status epilepticus: Secondary | ICD-10-CM | POA: Diagnosis not present

## 2018-11-05 DIAGNOSIS — Z20822 Contact with and (suspected) exposure to covid-19: Secondary | ICD-10-CM

## 2018-11-05 DIAGNOSIS — M791 Myalgia, unspecified site: Secondary | ICD-10-CM | POA: Diagnosis not present

## 2018-11-05 DIAGNOSIS — R509 Fever, unspecified: Secondary | ICD-10-CM | POA: Diagnosis not present

## 2018-11-05 DIAGNOSIS — S069X5D Unspecified intracranial injury with loss of consciousness greater than 24 hours with return to pre-existing conscious level, subsequent encounter: Secondary | ICD-10-CM | POA: Diagnosis not present

## 2018-11-06 LAB — NOVEL CORONAVIRUS, NAA: SARS-CoV-2, NAA: DETECTED — AB

## 2018-11-09 DIAGNOSIS — M791 Myalgia, unspecified site: Secondary | ICD-10-CM | POA: Diagnosis not present

## 2018-11-09 DIAGNOSIS — R509 Fever, unspecified: Secondary | ICD-10-CM | POA: Diagnosis not present

## 2018-11-09 DIAGNOSIS — J029 Acute pharyngitis, unspecified: Secondary | ICD-10-CM | POA: Diagnosis not present

## 2018-11-09 DIAGNOSIS — U071 COVID-19: Secondary | ICD-10-CM | POA: Diagnosis not present

## 2018-11-16 DIAGNOSIS — M791 Myalgia, unspecified site: Secondary | ICD-10-CM | POA: Diagnosis not present

## 2018-11-16 DIAGNOSIS — R509 Fever, unspecified: Secondary | ICD-10-CM | POA: Diagnosis not present

## 2018-11-16 DIAGNOSIS — U071 COVID-19: Secondary | ICD-10-CM | POA: Diagnosis not present

## 2018-11-16 DIAGNOSIS — J029 Acute pharyngitis, unspecified: Secondary | ICD-10-CM | POA: Diagnosis not present

## 2019-01-19 DIAGNOSIS — S069X5D Unspecified intracranial injury with loss of consciousness greater than 24 hours with return to pre-existing conscious level, subsequent encounter: Secondary | ICD-10-CM | POA: Diagnosis not present

## 2019-01-19 DIAGNOSIS — G40209 Localization-related (focal) (partial) symptomatic epilepsy and epileptic syndromes with complex partial seizures, not intractable, without status epilepticus: Secondary | ICD-10-CM | POA: Diagnosis not present

## 2019-01-19 DIAGNOSIS — R930 Abnormal findings on diagnostic imaging of skull and head, not elsewhere classified: Secondary | ICD-10-CM | POA: Diagnosis not present

## 2019-04-20 DIAGNOSIS — G40209 Localization-related (focal) (partial) symptomatic epilepsy and epileptic syndromes with complex partial seizures, not intractable, without status epilepticus: Secondary | ICD-10-CM | POA: Diagnosis not present

## 2019-04-20 DIAGNOSIS — S069X5D Unspecified intracranial injury with loss of consciousness greater than 24 hours with return to pre-existing conscious level, subsequent encounter: Secondary | ICD-10-CM | POA: Diagnosis not present

## 2019-04-20 DIAGNOSIS — R930 Abnormal findings on diagnostic imaging of skull and head, not elsewhere classified: Secondary | ICD-10-CM | POA: Diagnosis not present

## 2019-05-09 DIAGNOSIS — Z20828 Contact with and (suspected) exposure to other viral communicable diseases: Secondary | ICD-10-CM | POA: Diagnosis not present

## 2019-05-09 DIAGNOSIS — Z03818 Encounter for observation for suspected exposure to other biological agents ruled out: Secondary | ICD-10-CM | POA: Diagnosis not present

## 2019-06-19 DIAGNOSIS — M791 Myalgia, unspecified site: Secondary | ICD-10-CM | POA: Diagnosis not present

## 2019-06-19 DIAGNOSIS — R569 Unspecified convulsions: Secondary | ICD-10-CM | POA: Diagnosis not present

## 2019-06-19 DIAGNOSIS — I959 Hypotension, unspecified: Secondary | ICD-10-CM | POA: Diagnosis not present

## 2019-06-19 DIAGNOSIS — Z8616 Personal history of COVID-19: Secondary | ICD-10-CM | POA: Diagnosis not present

## 2019-06-19 DIAGNOSIS — G40909 Epilepsy, unspecified, not intractable, without status epilepticus: Secondary | ICD-10-CM | POA: Diagnosis not present

## 2019-06-19 DIAGNOSIS — R000 Tachycardia, unspecified: Secondary | ICD-10-CM | POA: Diagnosis not present

## 2019-06-19 DIAGNOSIS — Z79899 Other long term (current) drug therapy: Secondary | ICD-10-CM | POA: Diagnosis not present

## 2019-07-18 DIAGNOSIS — G40209 Localization-related (focal) (partial) symptomatic epilepsy and epileptic syndromes with complex partial seizures, not intractable, without status epilepticus: Secondary | ICD-10-CM | POA: Diagnosis not present

## 2019-07-18 DIAGNOSIS — S069X5D Unspecified intracranial injury with loss of consciousness greater than 24 hours with return to pre-existing conscious level, subsequent encounter: Secondary | ICD-10-CM | POA: Diagnosis not present

## 2019-07-18 DIAGNOSIS — R930 Abnormal findings on diagnostic imaging of skull and head, not elsewhere classified: Secondary | ICD-10-CM | POA: Diagnosis not present

## 2019-09-22 DIAGNOSIS — M546 Pain in thoracic spine: Secondary | ICD-10-CM | POA: Diagnosis not present

## 2019-09-22 DIAGNOSIS — M542 Cervicalgia: Secondary | ICD-10-CM | POA: Diagnosis not present

## 2019-09-22 DIAGNOSIS — G5601 Carpal tunnel syndrome, right upper limb: Secondary | ICD-10-CM | POA: Diagnosis not present

## 2019-11-30 DIAGNOSIS — H6590 Unspecified nonsuppurative otitis media, unspecified ear: Secondary | ICD-10-CM | POA: Diagnosis not present

## 2019-12-14 DIAGNOSIS — R930 Abnormal findings on diagnostic imaging of skull and head, not elsewhere classified: Secondary | ICD-10-CM | POA: Diagnosis not present

## 2019-12-14 DIAGNOSIS — G40209 Localization-related (focal) (partial) symptomatic epilepsy and epileptic syndromes with complex partial seizures, not intractable, without status epilepticus: Secondary | ICD-10-CM | POA: Diagnosis not present

## 2019-12-14 DIAGNOSIS — S069X5D Unspecified intracranial injury with loss of consciousness greater than 24 hours with return to pre-existing conscious level, subsequent encounter: Secondary | ICD-10-CM | POA: Diagnosis not present

## 2020-01-09 DIAGNOSIS — G40909 Epilepsy, unspecified, not intractable, without status epilepticus: Secondary | ICD-10-CM | POA: Diagnosis not present

## 2020-01-09 DIAGNOSIS — I10 Essential (primary) hypertension: Secondary | ICD-10-CM | POA: Diagnosis not present

## 2020-01-09 DIAGNOSIS — R635 Abnormal weight gain: Secondary | ICD-10-CM | POA: Diagnosis not present

## 2020-01-09 DIAGNOSIS — Z7185 Encounter for immunization safety counseling: Secondary | ICD-10-CM | POA: Diagnosis not present

## 2020-04-16 DIAGNOSIS — Z125 Encounter for screening for malignant neoplasm of prostate: Secondary | ICD-10-CM | POA: Diagnosis not present

## 2020-04-16 DIAGNOSIS — Z0000 Encounter for general adult medical examination without abnormal findings: Secondary | ICD-10-CM | POA: Diagnosis not present

## 2020-04-16 DIAGNOSIS — E782 Mixed hyperlipidemia: Secondary | ICD-10-CM | POA: Diagnosis not present

## 2020-04-19 DIAGNOSIS — R739 Hyperglycemia, unspecified: Secondary | ICD-10-CM | POA: Diagnosis not present

## 2020-04-19 DIAGNOSIS — Z0000 Encounter for general adult medical examination without abnormal findings: Secondary | ICD-10-CM | POA: Diagnosis not present

## 2020-04-19 DIAGNOSIS — Z1211 Encounter for screening for malignant neoplasm of colon: Secondary | ICD-10-CM | POA: Diagnosis not present

## 2020-04-19 DIAGNOSIS — Z23 Encounter for immunization: Secondary | ICD-10-CM | POA: Diagnosis not present

## 2020-04-23 DIAGNOSIS — T50Z95A Adverse effect of other vaccines and biological substances, initial encounter: Secondary | ICD-10-CM | POA: Diagnosis not present

## 2020-05-03 DIAGNOSIS — E785 Hyperlipidemia, unspecified: Secondary | ICD-10-CM | POA: Diagnosis not present

## 2020-05-03 DIAGNOSIS — R739 Hyperglycemia, unspecified: Secondary | ICD-10-CM | POA: Diagnosis not present

## 2020-05-03 DIAGNOSIS — E669 Obesity, unspecified: Secondary | ICD-10-CM | POA: Diagnosis not present

## 2020-05-03 DIAGNOSIS — I10 Essential (primary) hypertension: Secondary | ICD-10-CM | POA: Diagnosis not present

## 2020-06-13 DIAGNOSIS — G40209 Localization-related (focal) (partial) symptomatic epilepsy and epileptic syndromes with complex partial seizures, not intractable, without status epilepticus: Secondary | ICD-10-CM | POA: Diagnosis not present

## 2020-06-13 DIAGNOSIS — R930 Abnormal findings on diagnostic imaging of skull and head, not elsewhere classified: Secondary | ICD-10-CM | POA: Diagnosis not present

## 2020-06-13 DIAGNOSIS — S069X5D Unspecified intracranial injury with loss of consciousness greater than 24 hours with return to pre-existing conscious level, subsequent encounter: Secondary | ICD-10-CM | POA: Diagnosis not present

## 2020-06-14 DIAGNOSIS — Z23 Encounter for immunization: Secondary | ICD-10-CM | POA: Diagnosis not present

## 2020-08-20 DIAGNOSIS — I10 Essential (primary) hypertension: Secondary | ICD-10-CM | POA: Diagnosis not present

## 2020-08-20 DIAGNOSIS — E559 Vitamin D deficiency, unspecified: Secondary | ICD-10-CM | POA: Diagnosis not present

## 2020-08-20 DIAGNOSIS — E782 Mixed hyperlipidemia: Secondary | ICD-10-CM | POA: Diagnosis not present

## 2020-08-20 DIAGNOSIS — E669 Obesity, unspecified: Secondary | ICD-10-CM | POA: Diagnosis not present

## 2020-09-04 DIAGNOSIS — Z23 Encounter for immunization: Secondary | ICD-10-CM | POA: Diagnosis not present

## 2020-09-19 DIAGNOSIS — H31002 Unspecified chorioretinal scars, left eye: Secondary | ICD-10-CM | POA: Diagnosis not present

## 2020-10-23 DIAGNOSIS — I10 Essential (primary) hypertension: Secondary | ICD-10-CM | POA: Diagnosis not present

## 2020-10-23 DIAGNOSIS — E559 Vitamin D deficiency, unspecified: Secondary | ICD-10-CM | POA: Diagnosis not present

## 2020-10-23 DIAGNOSIS — E782 Mixed hyperlipidemia: Secondary | ICD-10-CM | POA: Diagnosis not present

## 2020-10-25 DIAGNOSIS — I10 Essential (primary) hypertension: Secondary | ICD-10-CM | POA: Diagnosis not present

## 2020-10-25 DIAGNOSIS — E559 Vitamin D deficiency, unspecified: Secondary | ICD-10-CM | POA: Diagnosis not present

## 2020-10-25 DIAGNOSIS — E669 Obesity, unspecified: Secondary | ICD-10-CM | POA: Diagnosis not present

## 2020-10-25 DIAGNOSIS — E782 Mixed hyperlipidemia: Secondary | ICD-10-CM | POA: Diagnosis not present

## 2020-11-06 DIAGNOSIS — M545 Low back pain, unspecified: Secondary | ICD-10-CM | POA: Diagnosis not present

## 2021-05-09 DIAGNOSIS — J020 Streptococcal pharyngitis: Secondary | ICD-10-CM | POA: Diagnosis not present

## 2021-05-09 DIAGNOSIS — R52 Pain, unspecified: Secondary | ICD-10-CM | POA: Diagnosis not present

## 2021-05-09 DIAGNOSIS — Z20822 Contact with and (suspected) exposure to covid-19: Secondary | ICD-10-CM | POA: Diagnosis not present

## 2021-05-09 DIAGNOSIS — R5383 Other fatigue: Secondary | ICD-10-CM | POA: Diagnosis not present

## 2021-06-13 DIAGNOSIS — E669 Obesity, unspecified: Secondary | ICD-10-CM | POA: Diagnosis not present

## 2021-06-13 DIAGNOSIS — U071 COVID-19: Secondary | ICD-10-CM | POA: Diagnosis not present

## 2021-06-13 DIAGNOSIS — I10 Essential (primary) hypertension: Secondary | ICD-10-CM | POA: Diagnosis not present

## 2021-07-19 DIAGNOSIS — I10 Essential (primary) hypertension: Secondary | ICD-10-CM | POA: Diagnosis not present

## 2021-07-19 DIAGNOSIS — G40909 Epilepsy, unspecified, not intractable, without status epilepticus: Secondary | ICD-10-CM | POA: Diagnosis not present

## 2022-03-07 DIAGNOSIS — Z13220 Encounter for screening for lipoid disorders: Secondary | ICD-10-CM | POA: Diagnosis not present

## 2022-03-07 DIAGNOSIS — Z0000 Encounter for general adult medical examination without abnormal findings: Secondary | ICD-10-CM | POA: Diagnosis not present

## 2022-03-07 DIAGNOSIS — Z114 Encounter for screening for human immunodeficiency virus [HIV]: Secondary | ICD-10-CM | POA: Diagnosis not present

## 2022-03-07 DIAGNOSIS — Z125 Encounter for screening for malignant neoplasm of prostate: Secondary | ICD-10-CM | POA: Diagnosis not present

## 2022-03-11 DIAGNOSIS — Z23 Encounter for immunization: Secondary | ICD-10-CM | POA: Diagnosis not present

## 2022-03-11 DIAGNOSIS — Z0000 Encounter for general adult medical examination without abnormal findings: Secondary | ICD-10-CM | POA: Diagnosis not present

## 2022-04-15 DIAGNOSIS — K219 Gastro-esophageal reflux disease without esophagitis: Secondary | ICD-10-CM | POA: Diagnosis not present

## 2022-04-15 DIAGNOSIS — E669 Obesity, unspecified: Secondary | ICD-10-CM | POA: Diagnosis not present

## 2022-04-15 DIAGNOSIS — Z1211 Encounter for screening for malignant neoplasm of colon: Secondary | ICD-10-CM | POA: Diagnosis not present

## 2022-04-15 DIAGNOSIS — I10 Essential (primary) hypertension: Secondary | ICD-10-CM | POA: Diagnosis not present

## 2022-05-23 DIAGNOSIS — Z1211 Encounter for screening for malignant neoplasm of colon: Secondary | ICD-10-CM | POA: Diagnosis not present
